# Patient Record
Sex: Male | Born: 1970 | Race: White | Hispanic: No | Marital: Married | Smoking: Never smoker
Health system: Southern US, Community
[De-identification: ages and names within clinical notes are randomized; demographics above are authoritative.]

## PROBLEM LIST (undated history)

## (undated) DIAGNOSIS — N401 Enlarged prostate with lower urinary tract symptoms: Secondary | ICD-10-CM

## (undated) DIAGNOSIS — N3281 Overactive bladder: Secondary | ICD-10-CM

## (undated) DIAGNOSIS — F1020 Alcohol dependence, uncomplicated: Secondary | ICD-10-CM

## (undated) DIAGNOSIS — F32A Depression, unspecified: Secondary | ICD-10-CM

## (undated) DIAGNOSIS — N138 Other obstructive and reflux uropathy: Secondary | ICD-10-CM

## (undated) DIAGNOSIS — G8929 Other chronic pain: Secondary | ICD-10-CM

## (undated) DIAGNOSIS — M6580 Other synovitis and tenosynovitis, unspecified site: Secondary | ICD-10-CM

## (undated) DIAGNOSIS — M659 Synovitis and tenosynovitis, unspecified: Secondary | ICD-10-CM

## (undated) DIAGNOSIS — G4730 Sleep apnea, unspecified: Secondary | ICD-10-CM

## (undated) DIAGNOSIS — E291 Testicular hypofunction: Secondary | ICD-10-CM

## (undated) DIAGNOSIS — F419 Anxiety disorder, unspecified: Secondary | ICD-10-CM

## (undated) DIAGNOSIS — Z87442 Personal history of urinary calculi: Secondary | ICD-10-CM

## (undated) HISTORY — DX: Anxiety disorder, unspecified: F41.9

## (undated) HISTORY — DX: Other synovitis and tenosynovitis, unspecified site: M65.80

## (undated) HISTORY — DX: Testicular hypofunction: E29.1

## (undated) HISTORY — DX: Other obstructive and reflux uropathy: N13.8

## (undated) HISTORY — PX: OTHER SURGICAL HISTORY: SHX169

## (undated) HISTORY — DX: Synovitis and tenosynovitis, unspecified: M65.9

## (undated) HISTORY — DX: Alcohol dependence, uncomplicated: F10.20

## (undated) HISTORY — DX: Other obstructive and reflux uropathy: N40.1

## (undated) HISTORY — DX: Overactive bladder: N32.81

## (undated) HISTORY — DX: Depression, unspecified: F32.A

## (undated) HISTORY — DX: Other chronic pain: G89.29

---

## 2020-12-14 ENCOUNTER — Encounter: Payer: Self-pay | Admitting: Family Medicine

## 2020-12-14 ENCOUNTER — Ambulatory Visit: Payer: 59 | Admitting: Family Medicine

## 2020-12-14 ENCOUNTER — Ambulatory Visit (INDEPENDENT_AMBULATORY_CARE_PROVIDER_SITE_OTHER): Payer: 59

## 2020-12-14 ENCOUNTER — Other Ambulatory Visit: Payer: Self-pay

## 2020-12-14 VITALS — BP 132/72 | HR 75 | Wt 205.0 lb

## 2020-12-14 DIAGNOSIS — M25552 Pain in left hip: Secondary | ICD-10-CM | POA: Diagnosis not present

## 2020-12-14 DIAGNOSIS — M9903 Segmental and somatic dysfunction of lumbar region: Secondary | ICD-10-CM

## 2020-12-14 DIAGNOSIS — M545 Low back pain, unspecified: Secondary | ICD-10-CM

## 2020-12-14 DIAGNOSIS — M9902 Segmental and somatic dysfunction of thoracic region: Secondary | ICD-10-CM | POA: Diagnosis not present

## 2020-12-14 DIAGNOSIS — M25551 Pain in right hip: Secondary | ICD-10-CM | POA: Diagnosis not present

## 2020-12-14 DIAGNOSIS — M533 Sacrococcygeal disorders, not elsewhere classified: Secondary | ICD-10-CM | POA: Diagnosis not present

## 2020-12-14 DIAGNOSIS — M9904 Segmental and somatic dysfunction of sacral region: Secondary | ICD-10-CM | POA: Diagnosis not present

## 2020-12-14 IMAGING — DX DG PELVIS 1-2V
2 series · 2 of 2 positions shown · non-contrast
Comparison: None.

CLINICAL DATA: 49-year-old male with bilateral hip pain.

EXAM:
PELVIS - 1-2 VIEW

[pelvis ap (1 of 2)]
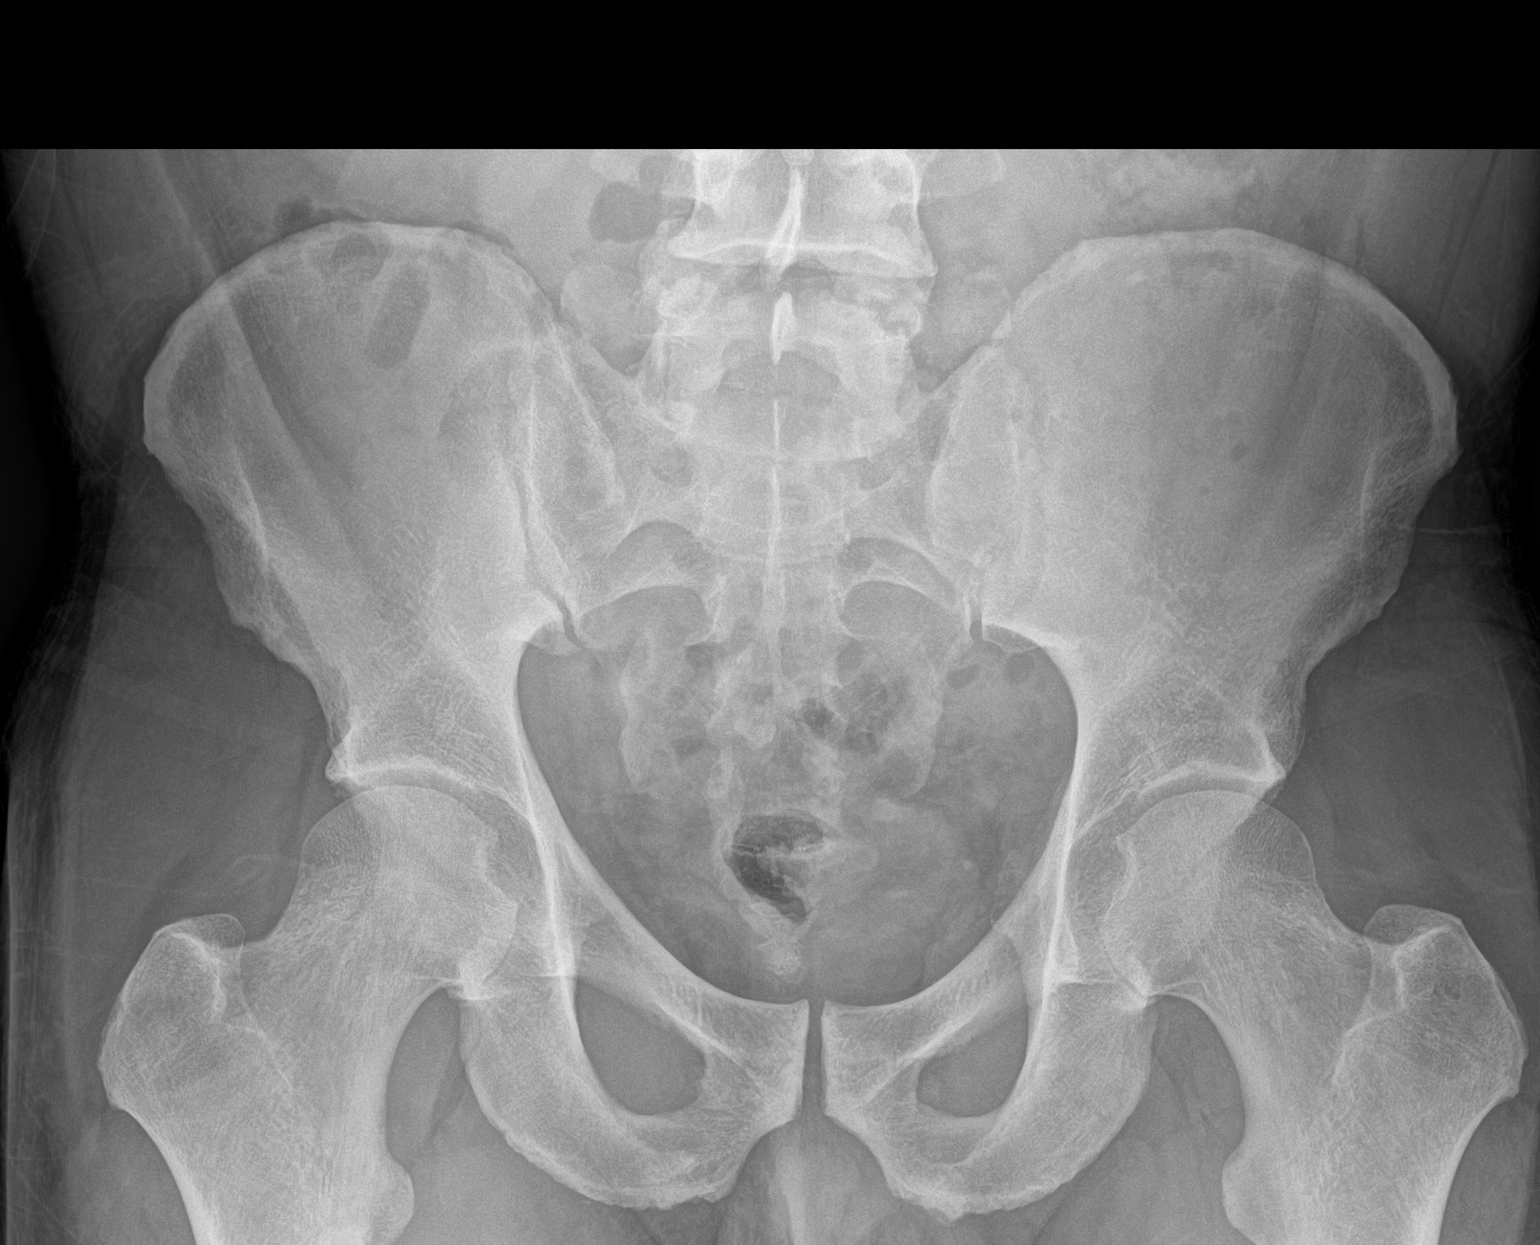

[pelvis ap (2 of 2)]
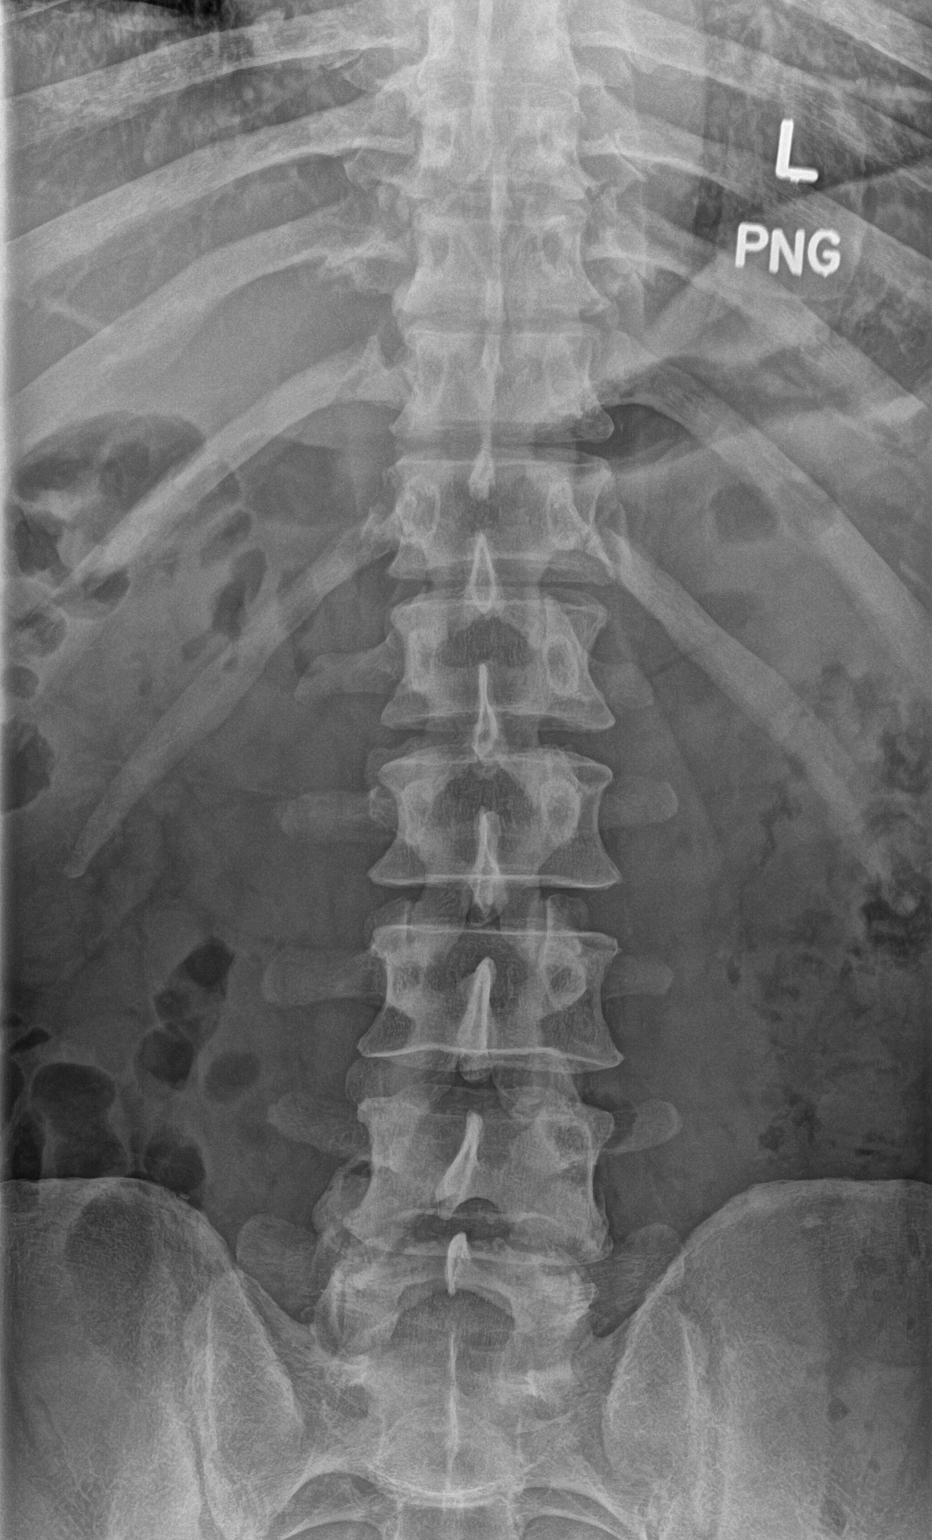

[2 of 2 positions shown; findings below may reference images not displayed]

FINDINGS: There is no acute fracture or dislocation. The bones are well
mineralized. Mild arthritic changes of the hips bilaterally. The
soft tissues are unremarkable.
IMPRESSION: No acute fracture or dislocation.

## 2020-12-14 IMAGING — DX DG LUMBAR SPINE 2-3V
3 series · 3 of 3 positions shown · non-contrast
Comparison: None.

CLINICAL DATA: 49-year-old male with back pain.

EXAM:
LUMBAR SPINE - 2-3 VIEW

[l-spine lateral (1 of 2)]
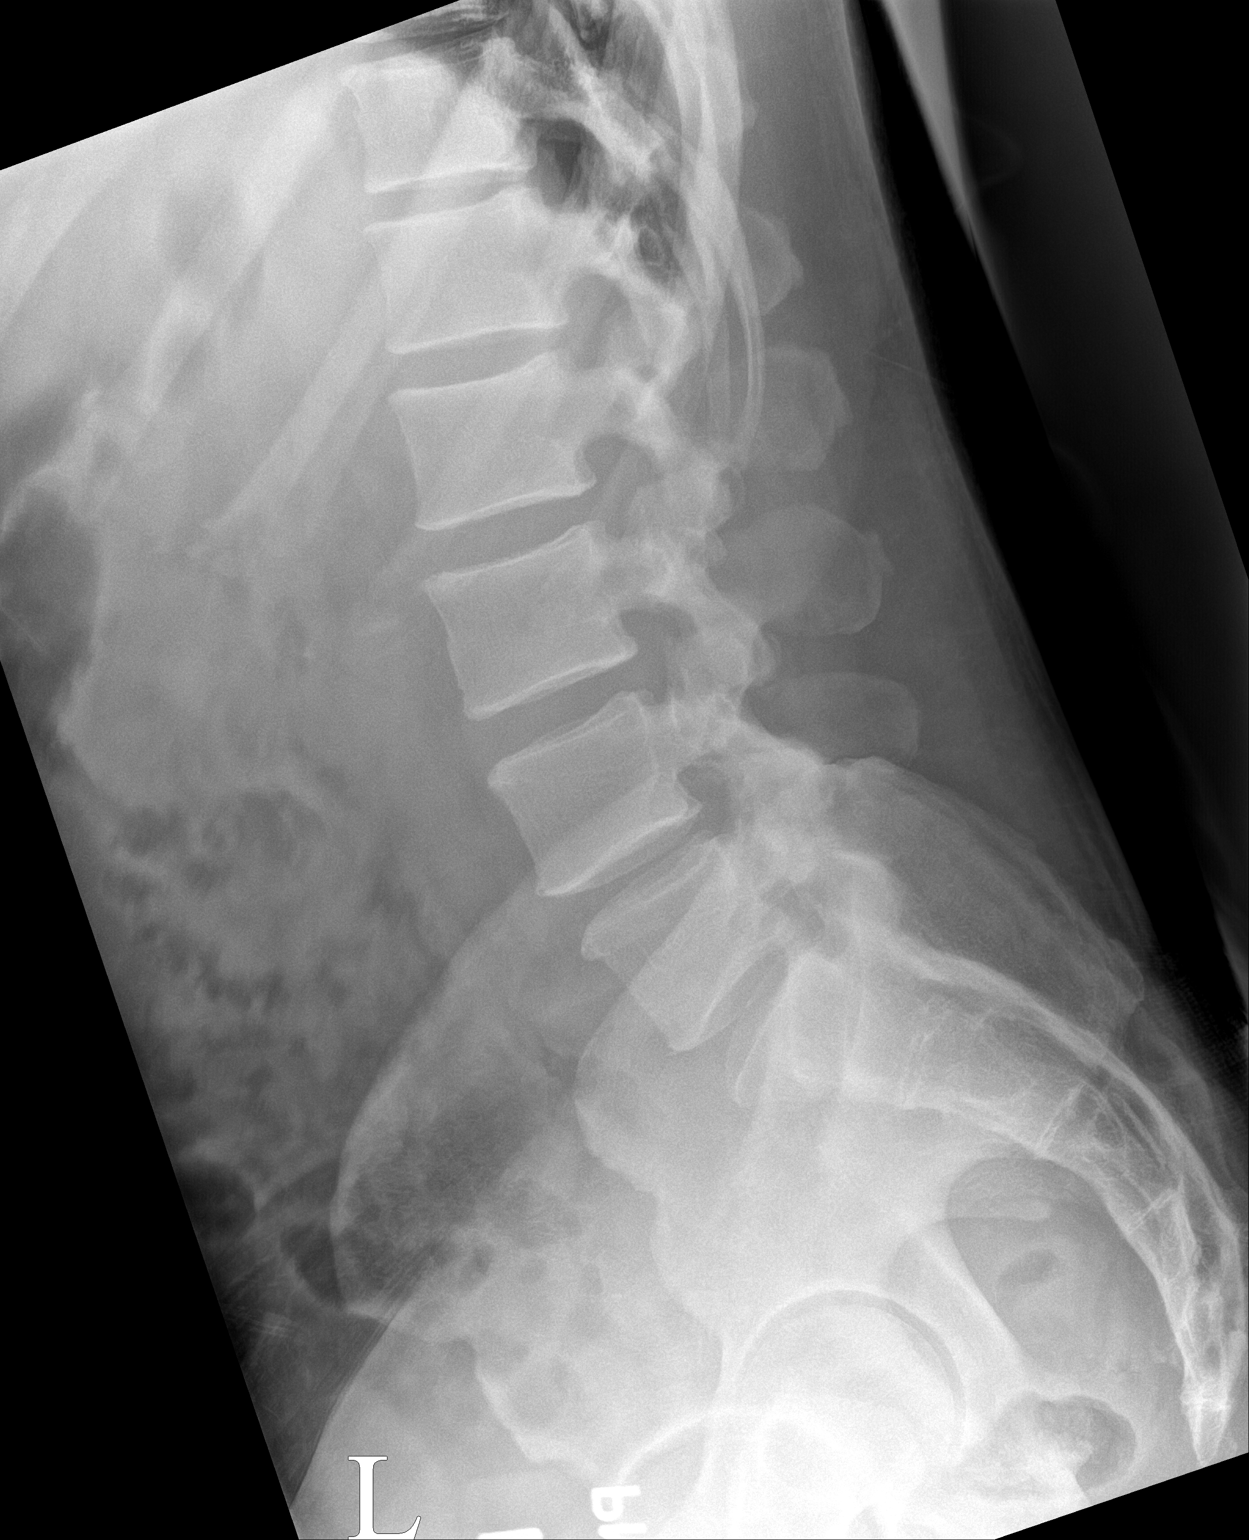

[l-spine lateral (2 of 2)]
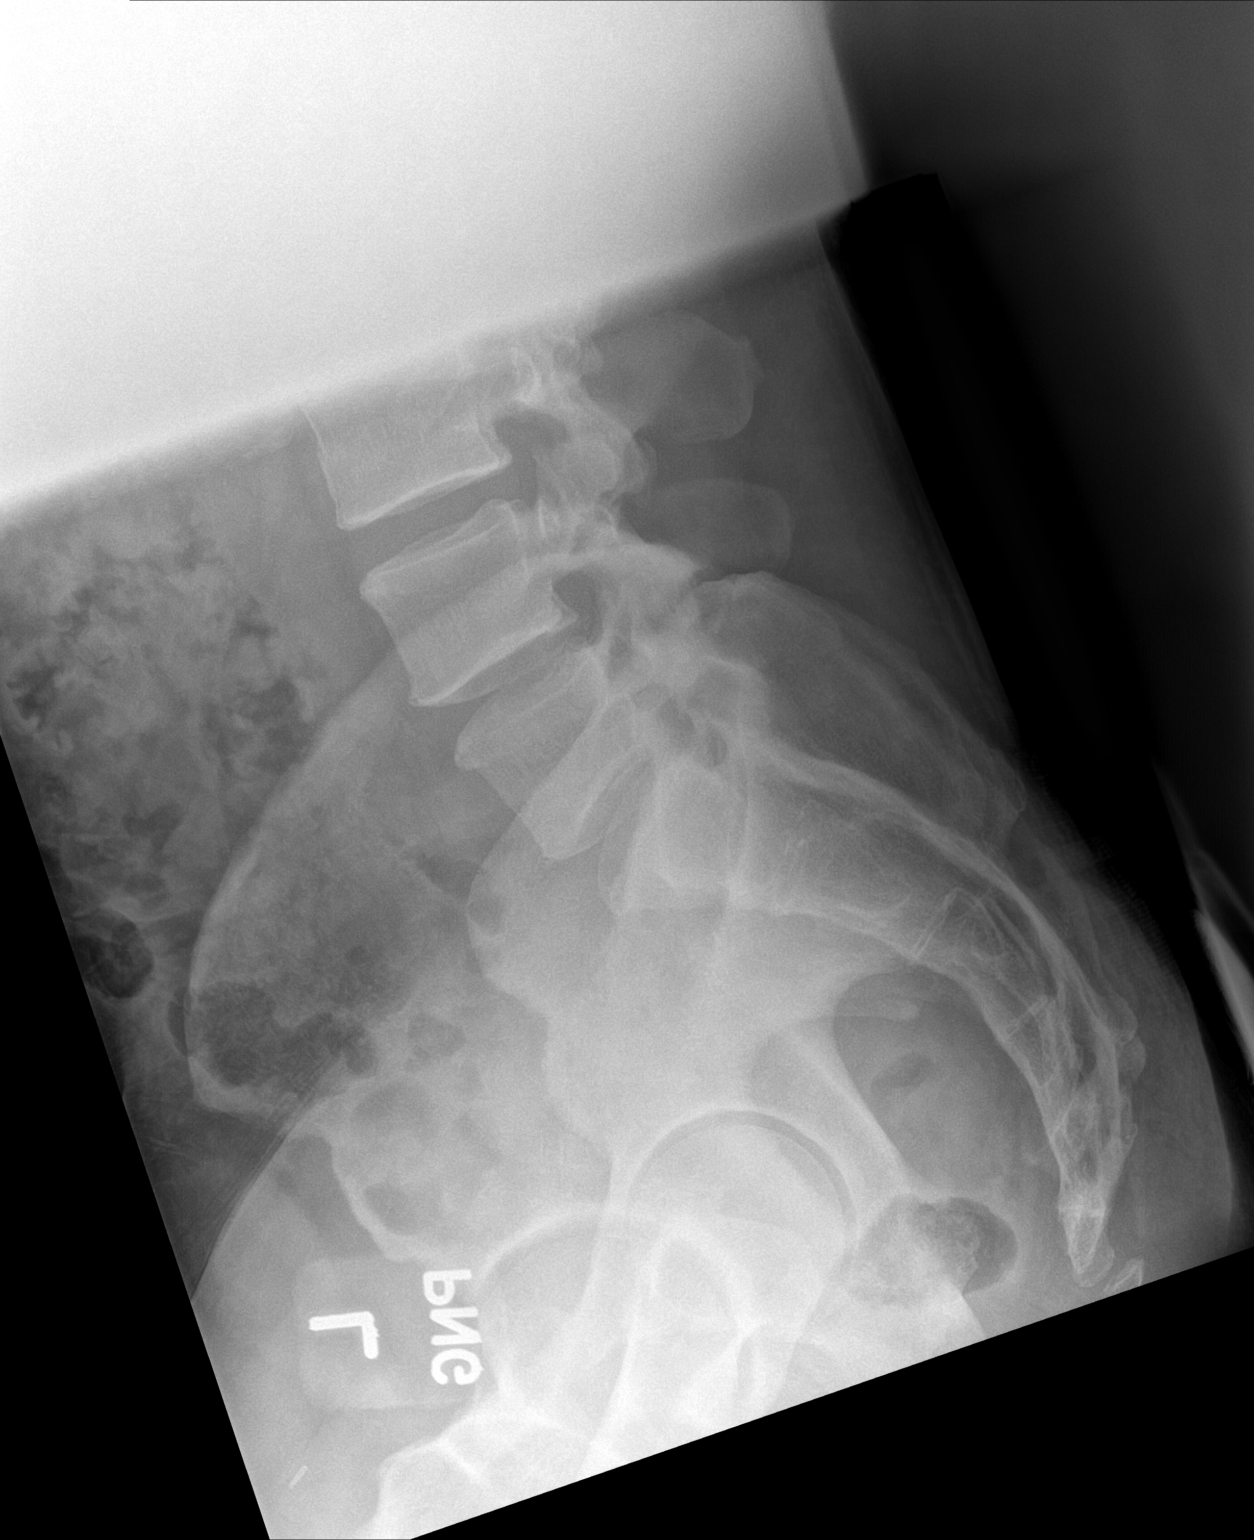

[pelvis ap]
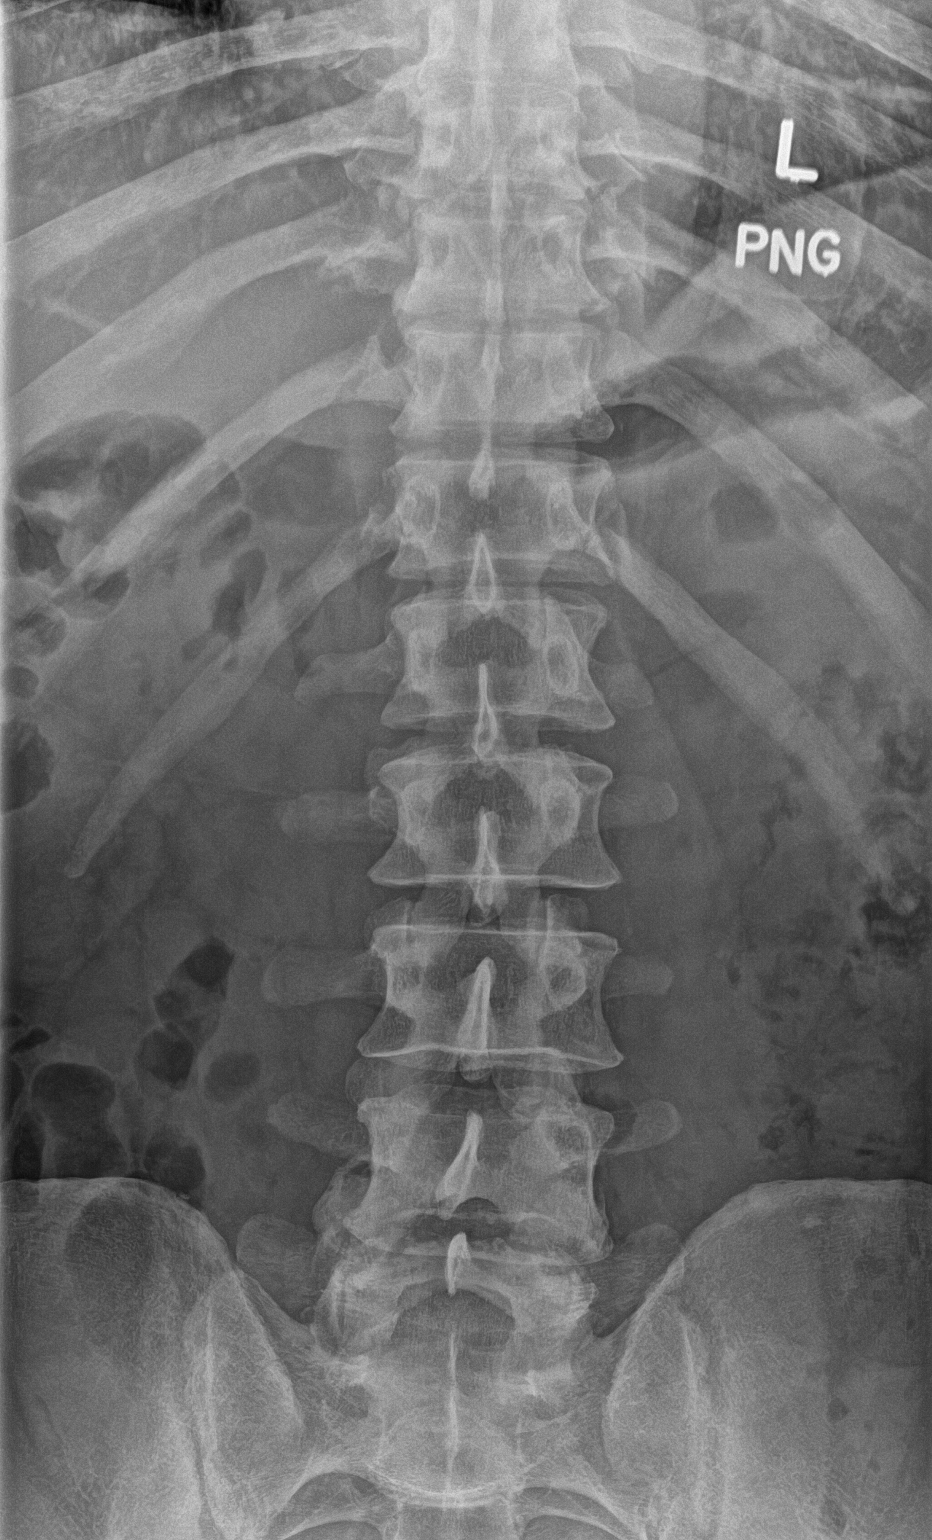

[3 of 3 positions shown; findings below may reference images not displayed]

FINDINGS: Five lumbar type vertebra. There is no acute fracture or subluxation
of the lumbar spine. The vertebral body heights are maintained. Mild
degenerative changes and spurring primarily at L4-L5 and L5-S1. The
visualized posterior elements are intact. The soft tissues are
unremarkable.
IMPRESSION: 1. No acute/traumatic lumbar spine pathology.
2. Mild degenerative changes.

## 2020-12-14 MED ORDER — GABAPENTIN 100 MG PO CAPS: 200.0000 mg | ORAL_CAPSULE | Freq: Every day | ORAL | 0 refills | Status: DC

## 2020-12-14 MED ORDER — MELOXICAM 15 MG PO TABS: 15.0000 mg | ORAL_TABLET | Freq: Every day | ORAL | 0 refills | Status: DC

## 2020-12-14 NOTE — Assessment & Plan Note (Signed)
Patient does have some sacroiliac dysfunction.  Discussed gabapentin and meloxicam.  Discussed posture and ergonomics.  We will get x-rays to further evaluate for any bony abnormality that could be potentially contributing.  Work with Event organiser to learn them in greater response.  Patient responded well to osteopathic manipulation will follow up again in 5 to 6 weeks.

## 2020-12-14 NOTE — Progress Notes (Signed)
[name] [address] [place] [address] Phone: [phone] Subjective:   [name], am serving as a scribe for Dr. Antoine [name].  This visit occurred during the SARS-CoV-2 public health emergency.  Safety protocols were in place, including screening questions prior to the visit, additional usage of staff PPE, and extensive cleaning of exam room while observing appropriate contact time as indicated for disinfecting solutions.   I'm seeing this patient by the request  of:  Pcp, No  CC: Low back pain  [id number]  Joshua [name] is a 50 y.o. male coming in with complaint of back pain. Patient states that he has R glute pain but it can be L glute. Pain is dull with standing. Movement helps with pain. Takes Aleve. Has been stretching.  Reviewing patient's chart he has been in the pelvic floor rehabilitation for 6 weeks.  Social History   Socioeconomic History   Marital status: Married    Spouse name: Not on file   Number of children: Not on file   Years of education: Not on file   Highest education level: Not on file  Occupational History   Not on file  Tobacco Use   Smoking status: Not on file   Smokeless tobacco: Not on file  Substance and Sexual Activity   Alcohol use: Not on file   Drug use: Not on file   Sexual activity: Not on file  Other Topics Concern   Not on file  Social History Narrative   Not on file   Social Determinants of Health   Financial Resource Strain: Not on file  Food Insecurity: Not on file  Transportation Needs: Not on file  Physical Activity: Not on file  Stress: Not on file  Social Connections: Not on file   Not on File No family history on file.     Current Outpatient Medications (Analgesics):    meloxicam (MOBIC) 15 MG tablet, Take 1 tablet (15 mg total) by mouth daily.   Current Outpatient Medications (Other):    gabapentin (NEURONTIN) 100 MG capsule, Take 2 capsules (200 mg total) by  mouth at bedtime.   Reviewed prior external information including notes and imaging from  primary care provider As well as notes that were available from care everywhere and other healthcare systems.  Past medical history, social, surgical and family history all reviewed in electronic medical record.  No pertanent information unless stated regarding to the chief complaint.   Review of Systems:  No headache, visual changes, nausea, vomiting, diarrhea, constipation, dizziness, abdominal pain, skin rash, fevers, chills, night sweats, weight loss, swollen lymph nodes, body aches, joint swelling, chest pain, shortness of breath, mood changes. POSITIVE muscle aches  Objective  Blood pressure 132/72, pulse 75, weight 205 lb (93 kg), SpO2 97 %.   General: No apparent distress alert and oriented x3 mood and affect normal, dressed appropriately.  HEENT: Pupils equal, extraocular movements intact  Respiratory: Patient's speak in full sentences and does not appear short of breath  Cardiovascular: No lower extremity edema, non tender, no erythema  Gait normal with good balance and coordination.  MSK: Low back exam shows the patient does have tenderness to palpation of the paraspinal musculature.  Patient does have tightness of the lumbar spine mostly around the sacroiliac joint. Negative straight leg test.  Patient does have locking of the last 5 degrees of extension of the back.  5/5 strength in lower extremities.  Osteopathic findings T5 extended rotated and  side bent right L1 flexed rotated and side bent right Sacrum right on right  [id number]; 15 additional minutes spent for Therapeutic exercises as stated in above notes.  This included exercises focusing on stretching, strengthening, with significant focus on eccentric aspects.   Long term goals include an improvement in range of motion, strength, endurance as well as avoiding reinjury. Patient's frequency would include in 1-2 times a day, 3-5 times  a week for a duration of 6-12 weeks.  Sacroiliac Joint Mobilization and Rehab 1. Work on pretzel stretching, shoulder back and leg draped in front. 3-5 sets, 30 sec.. 2. hip abductor rotations. standing, hip flexion and rotation outward then inward. 3 sets, 15 reps. when can do comfortably, add ankle weights starting at 2 pounds.  3. cross over stretching - shoulder back to ground, same side leg crossover. 3-5 sets for 30 min..  4. rolling up and back knees to chest and rocking. 5. sacral tilt - 5 sets, hold for 5-10 seconds Proper technique shown and discussed handout in great detail with ATC.  All questions were discussed and answered.     Impression and Recommendations:     The above documentation has been reviewed and is accurate and complete Joshua Saa, DO

## 2020-12-14 NOTE — Patient Instructions (Addendum)
SI joint Meloxicam 15mg  for 3 days  Gabapentin 200mg  at night See me in 6-8 weeks

## 2020-12-14 NOTE — Assessment & Plan Note (Signed)

## 2021-01-25 ENCOUNTER — Ambulatory Visit: Payer: 59 | Admitting: Family Medicine

## 2021-01-25 ENCOUNTER — Encounter: Payer: Self-pay | Admitting: Family Medicine

## 2021-01-25 ENCOUNTER — Other Ambulatory Visit: Payer: Self-pay

## 2021-01-25 VITALS — BP 128/84 | HR 67 | Ht 70.0 in | Wt 206.0 lb

## 2021-01-25 DIAGNOSIS — M9904 Segmental and somatic dysfunction of sacral region: Secondary | ICD-10-CM

## 2021-01-25 DIAGNOSIS — M9903 Segmental and somatic dysfunction of lumbar region: Secondary | ICD-10-CM

## 2021-01-25 DIAGNOSIS — M533 Sacrococcygeal disorders, not elsewhere classified: Secondary | ICD-10-CM

## 2021-01-25 DIAGNOSIS — M9902 Segmental and somatic dysfunction of thoracic region: Secondary | ICD-10-CM

## 2021-01-25 NOTE — Progress Notes (Signed)
[name] [address] [place] [address] Phone: [phone] Subjective:   [name], am serving as a scribe for Dr. Antoine Primas.  This visit occurred during the SARS-CoV-2 public health emergency.  Safety protocols were in place, including screening questions prior to the visit, additional usage of staff PPE, and extensive cleaning of exam room while observing appropriate contact time as indicated for disinfecting solutions.   I'm seeing this patient by the request  of:  Pcp, No  CC: Low back pain  [id number]  Joshua [name] is a 50 y.o. male coming in with complaint of back and neck pain. OMT 12/14/2020. Patient states that he still has pain intermittently in lumbar spine.  Patient states that the medication has helped him significantly with some of his back as well as even with his urinary incontinence.  Sleeping well comfortably as well.  Regular basis.  Medications patient has been prescribed: Gabapentin, Meloxicam  Taking: Yes         Reviewed prior external information including notes and imaging from previsou exam, outside providers and external EMR if available.   As well as notes that were available from care everywhere and other healthcare systems.  Past medical history, social, surgical and family history all reviewed in electronic medical record.  No pertanent information unless stated regarding to the chief complaint.   No past medical history on file.  Not on File   Review of Systems:  No headache, visual changes, nausea, vomiting, diarrhea, constipation, dizziness, abdominal pain, skin rash, fevers, chills, night sweats, weight loss, swollen lymph nodes, body aches, joint swelling, chest pain, shortness of breath, mood changes. POSITIVE muscle aches  Objective  Blood pressure 128/84, pulse 67, height 5\' 10"  (1.778 m), weight 206 lb (93.4 kg), SpO2 99 %.   General: No apparent distress alert and oriented x3 mood  and affect normal, dressed appropriately.  HEENT: Pupils equal, extraocular movements intact  Respiratory: Patient's speak in full sentences and does not appear short of breath  Cardiovascular: No lower extremity edema, non tender, no erythema  Low back exam still has some tightness of the right sacroiliac joint.  He does have some limitation in extension of the last 5 degrees.  Mild tightness with FABER test on the right compared to left.  Osteopathic findings   T5 extended rotated and side bent left L2 flexed rotated and side bent right Sacrum right on right       Assessment and Plan:  SI (sacroiliac) joint dysfunction Patient overall seems to be doing relatively well.  Continue to have some discomfort encourage patient to try the gabapentin up to 300 mg and see how he does and we can refill at a higher dose if necessary.  Patient states that it is helpful with some of his urinary incontinence as well.  Discussed with patient to continue to be active otherwise.  Follow-up with me again 6 to 8 weeks   Nonallopathic problems  Decision today to treat with OMT was based on Physical Exam  After verbal consent patient was treated with HVLA, ME, FPR techniques in  thoracic, lumbar, and sacral  areas  Patient tolerated the procedure well with improvement in symptoms  Patient given exercises, stretches and lifestyle modifications  See medications in patient instructions if given  Patient will follow up in 4-8 weeks      The above documentation has been reviewed and is accurate and complete , DO  Note: This dictation was prepared with Dragon dictation along with smaller phrase technology. Any transcriptional errors that result from this process are unintentional.

## 2021-01-25 NOTE — Patient Instructions (Signed)
Overall good Continue same stretches Avoid repetitive extension of back See me in 7-8 weeks

## 2021-01-25 NOTE — Assessment & Plan Note (Signed)
Patient overall seems to be doing relatively well.  Continue to have some discomfort encourage patient to try the gabapentin up to 300 mg and see how he does and we can refill at a higher dose if necessary.  Patient states that it is helpful with some of his urinary incontinence as well.  Discussed with patient to continue to be active otherwise.  Follow-up with me again 6 to 8 weeks

## 2021-03-14 NOTE — Progress Notes (Signed)
[name] [address] [place] [address] Phone: [phone] Subjective:   Joshua [name], am serving as a scribe for Dr. Antoine Primas. This visit occurred during the SARS-CoV-2 public health emergency.  Safety protocols were in place, including screening questions prior to the visit, additional usage of staff PPE, and extensive cleaning of exam room while observing appropriate contact time as indicated for disinfecting solutions.   I'm seeing this patient by the request  of:  Pcp, No  CC: Back pain follow-up and knee pain  [id number]  Joshua [name] is a 50 y.o. male coming in with complaint of back and neck pain. OMT 01/25/2021. Patient states back and neck pain remain the same. Does want to talk about his right knee pain. Was told he needed surgery, but wants a second opinion.  Patient has had 5 surgeries on this knee previously.  States that he continues to have decreased range of motion of the knee.  Wants to know if there is anything else that can be done.  Medications patient has been prescribed: Gabapentin, Meloxicam  Taking:         Reviewed prior external information including notes and imaging from previsou exam, outside providers and external EMR if available.   As well as notes that were available from care everywhere and other healthcare systems.  Past medical history, social, surgical and family history all reviewed in electronic medical record.  No pertanent information unless stated regarding to the chief complaint.   No past medical history on file.  Not on File   Review of Systems:  No headache, visual changes, nausea, vomiting, diarrhea, constipation, dizziness, abdominal pain, skin rash, fevers, chills, night sweats, weight loss, swollen lymph nodes, body aches, joint swelling, chest pain, shortness of breath, mood changes. POSITIVE muscle aches  Objective  Blood pressure 140/68, pulse 75, height 5\' 10"  (1.778 m),  weight 208 lb (94.3 kg), SpO2 98 %.   General: No apparent distress alert and oriented x3 mood and affect normal, dressed appropriately.  HEENT: Pupils equal, extraocular movements intact  Respiratory: Patient's speak in full sentences and does not appear short of breath  Cardiovascular: No lower extremity edema, non tender, no erythema  Right knee exam shows the patient does lack the last 10 degrees of flexion.  Fullness noted in the popliteal area.  Patient does have tenderness over the medial joint line.  Full extension noted.  Good muscle strength surrounding the knee Low back continues to have tightness of the sacroiliac joint right greater than left.  Patient does have a positive FABER test.  Negative straight leg test noted.  Osteopathic findings   T9 extended rotated and side bent left L2 flexed rotated and side bent right Sacrum right on right   Limited muscular skeletal ultrasound was performed and interpreted by , M  Limited ultrasound of patient's right knee shows that patient does have a moderate to severe narrowing of the medial joint space and postsurgical changes of the meniscus noted.  Patient also has a large Baker's cyst noted.  Mild to moderate narrowing of the patellofemoral joint with small effusion.  Procedure: Real-time Ultrasound Guided Injection of right knee Device: GE Logiq Q7 Ultrasound guided injection is preferred based studies that show increased duration, increased effect, greater accuracy, decreased procedural pain, increased response rate, and decreased cost with ultrasound guided versus blind injection.  Verbal informed consent obtained.  Time-out conducted.  Noted no overlying erythema, induration, or other signs  of local infection.  Skin prepped in a sterile fashion.  Local anesthesia: Topical Ethyl chloride.  With sterile technique and under real time ultrasound guidance: With a 22-gauge 2 inch needle patient was injected with 4 cc of  0.5% Marcaine and had difficulty with aspiration and injected patient again and then had aspiration of 12 cc of straw-colored fluid and then injected 1 cc of Kenalog 40 mg/dL. This was from a posterior approach.   Pain improved with improvement of range of motion suggesting accurate placement of the medication.  Advised to call if fevers/chills, erythema, induration, drainage, or persistent bleeding.  Impression: Technically successful ultrasound guided injection.    Assessment and Plan:  Degenerative arthritis of right knee Patient has had 5 previous surgeries on this knee.  Patient has had removal of the medial meniscus and does have fairly advanced osteoarthritic changes for his age.  Baker's cyst noted as well and attempted aspiration today with some improvement.  Discussed with patient about icing regimen and home exercises.  We will get approval for possible viscosupplementation.  Follow-up again in 6 to 8 weeks Did need to attempt aspiration to time so did give patient doxycycline in case there is any redness noted patient to start the antibiotics.  SI (sacroiliac) joint dysfunction Chronic problem with exacerbation.  Discussed icing regimen and home exercises, discussed which activities to do which wants to avoid.  Follow-up again in 6 to 8 weeks   Nonallopathic problems  Decision today to treat with OMT was based on Physical Exam  After verbal consent patient was treated with HVLA, ME, FPR techniques in  thoracic, lumbar, and sacral  areas  Patient tolerated the procedure well with improvement in symptoms  Patient given exercises, stretches and lifestyle modifications  See medications in patient instructions if given  Patient will follow up in 4-8 weeks      The above documentation has been reviewed and is accurate and complete Joshua Saa, DO        Note: This dictation was prepared with Dragon dictation along with smaller phrase technology. Any transcriptional  errors that result from this process are unintentional.

## 2021-03-15 ENCOUNTER — Ambulatory Visit (INDEPENDENT_AMBULATORY_CARE_PROVIDER_SITE_OTHER): Payer: 59

## 2021-03-15 ENCOUNTER — Encounter: Payer: Self-pay | Admitting: Family Medicine

## 2021-03-15 ENCOUNTER — Ambulatory Visit: Payer: 59 | Admitting: Family Medicine

## 2021-03-15 ENCOUNTER — Ambulatory Visit: Payer: Self-pay

## 2021-03-15 ENCOUNTER — Other Ambulatory Visit: Payer: Self-pay

## 2021-03-15 VITALS — BP 140/68 | HR 75 | Ht 70.0 in | Wt 208.0 lb

## 2021-03-15 DIAGNOSIS — M533 Sacrococcygeal disorders, not elsewhere classified: Secondary | ICD-10-CM | POA: Diagnosis not present

## 2021-03-15 DIAGNOSIS — M9904 Segmental and somatic dysfunction of sacral region: Secondary | ICD-10-CM | POA: Diagnosis not present

## 2021-03-15 DIAGNOSIS — M25561 Pain in right knee: Secondary | ICD-10-CM | POA: Diagnosis not present

## 2021-03-15 DIAGNOSIS — M9902 Segmental and somatic dysfunction of thoracic region: Secondary | ICD-10-CM

## 2021-03-15 DIAGNOSIS — M9903 Segmental and somatic dysfunction of lumbar region: Secondary | ICD-10-CM | POA: Diagnosis not present

## 2021-03-15 DIAGNOSIS — M1711 Unilateral primary osteoarthritis, right knee: Secondary | ICD-10-CM

## 2021-03-15 DIAGNOSIS — M170 Bilateral primary osteoarthritis of knee: Secondary | ICD-10-CM | POA: Insufficient documentation

## 2021-03-15 IMAGING — DX DG KNEE 3 VIEWS*R*
3 series · 3 of 3 positions shown · non-contrast
Comparison: None.

CLINICAL DATA: Right-sided knee pain

EXAM:
RIGHT KNEE - 3 VIEW

[knee ap]
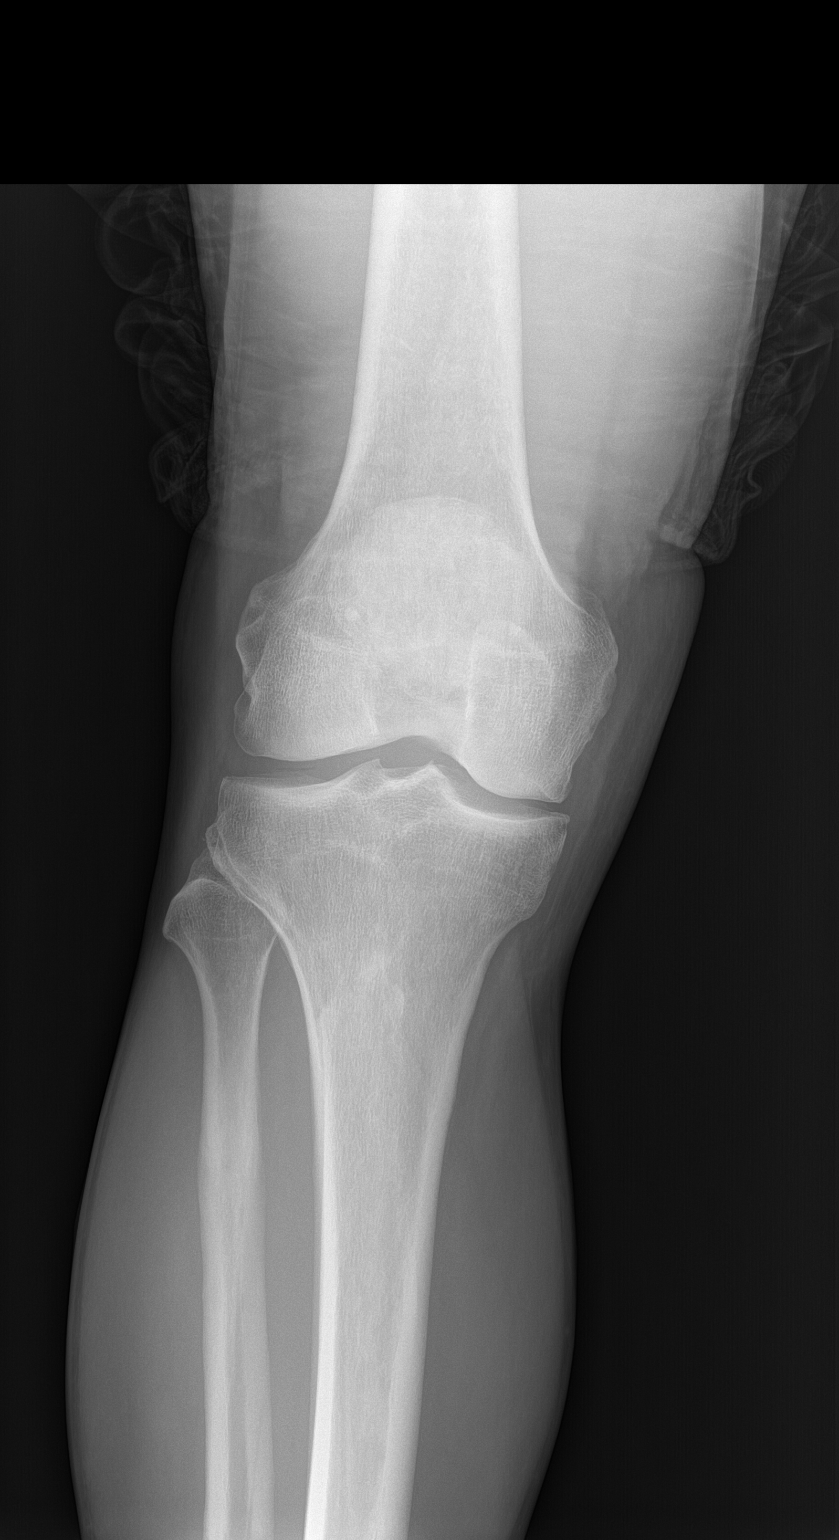

[knee lat]
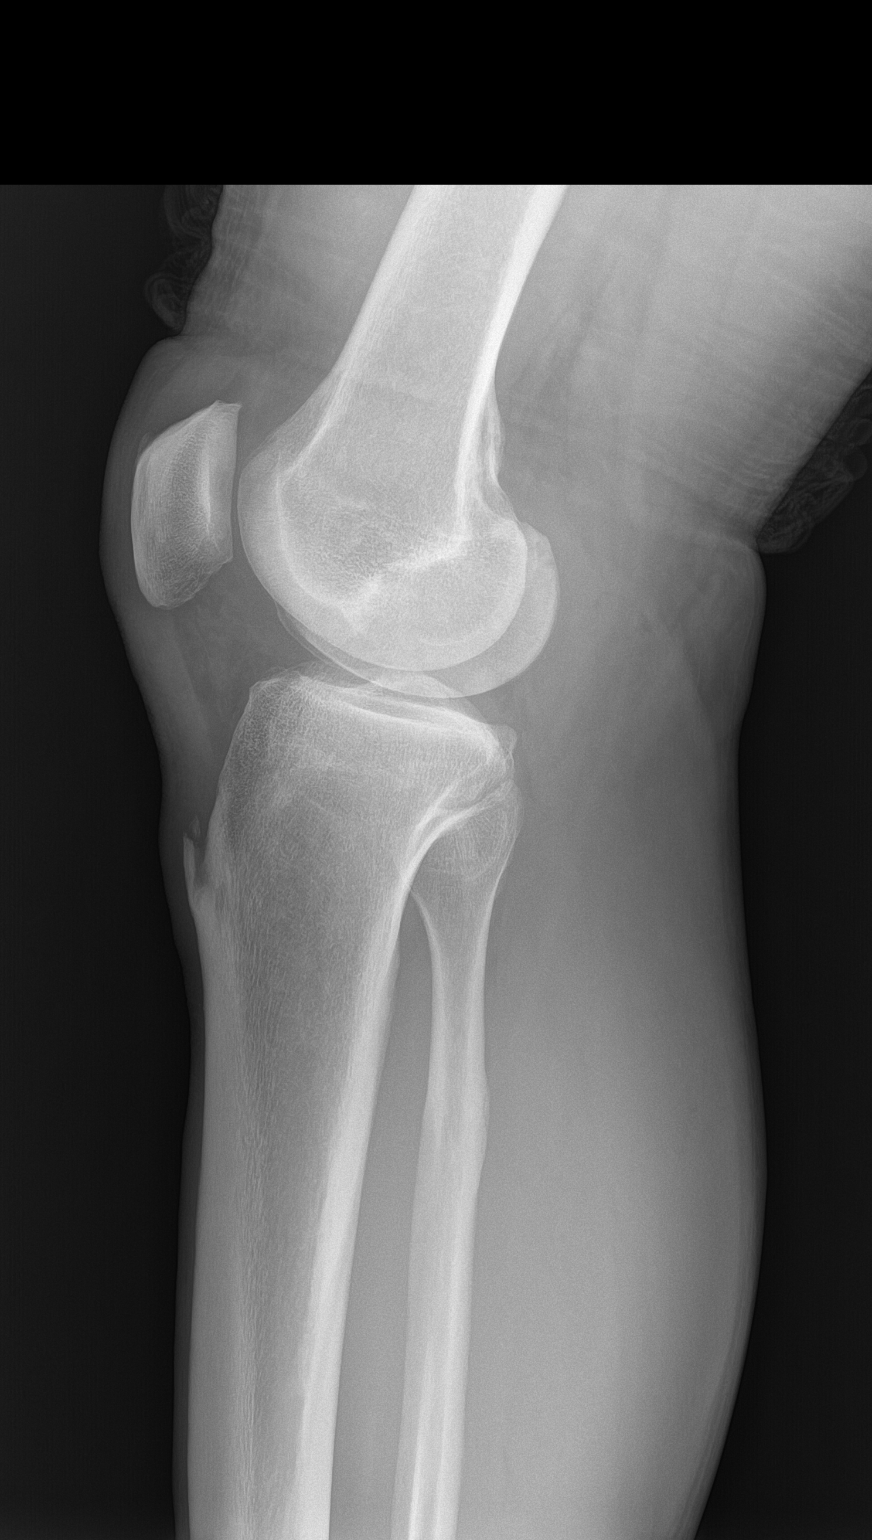

[patella]
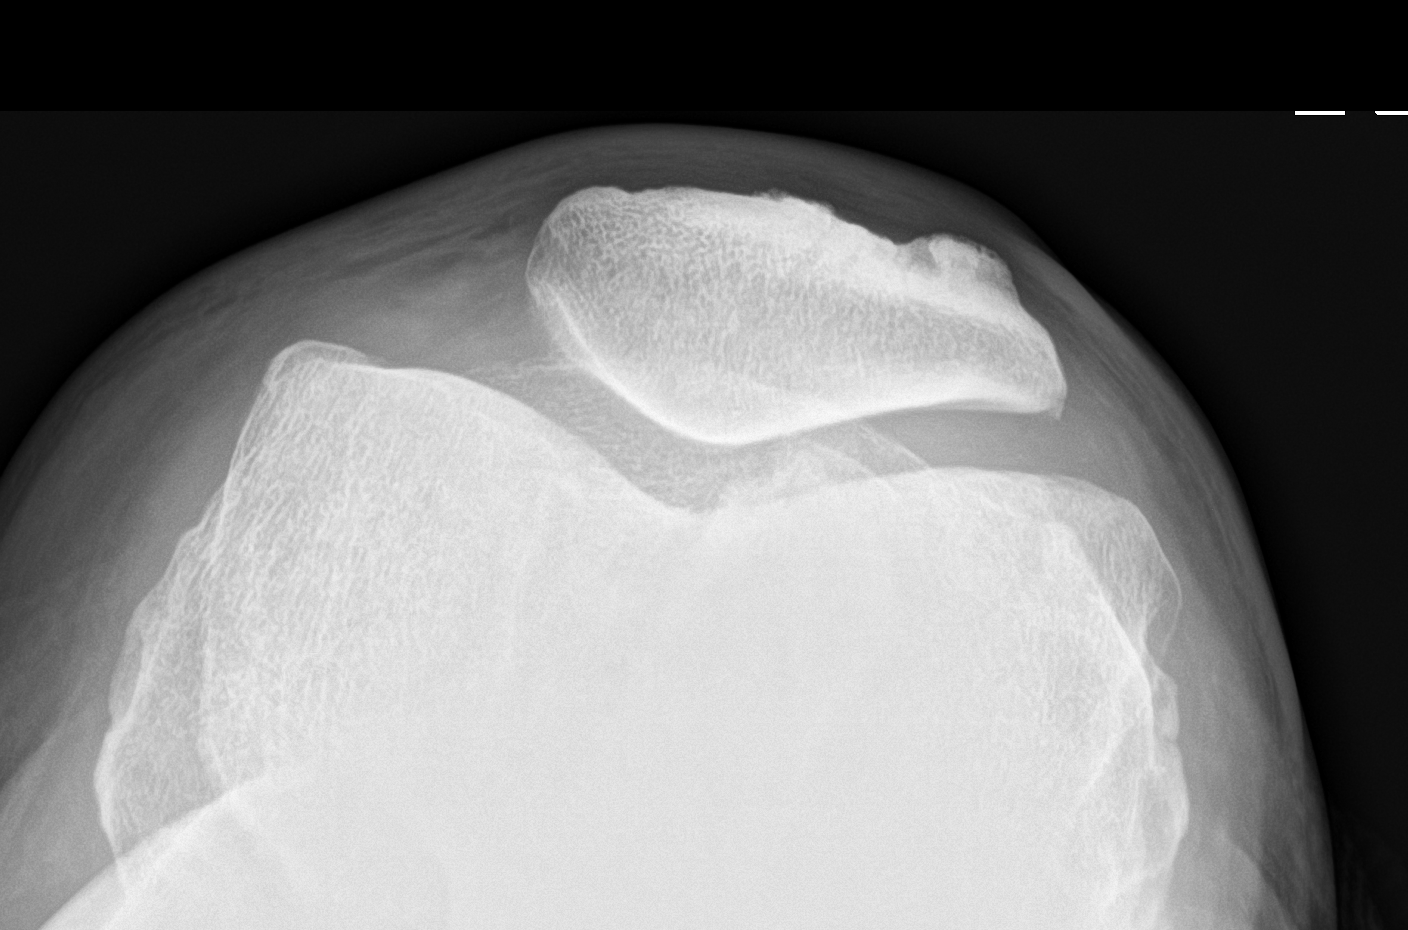

[3 of 3 positions shown; findings below may reference images not displayed]

FINDINGS: No fracture or malalignment. Mild medial degenerative change. No
sizable knee effusion
IMPRESSION: Mild degenerative change.  No acute osseous abnormality

## 2021-03-15 MED ORDER — DOXYCYCLINE HYCLATE 100 MG PO TABS: 100.0000 mg | ORAL_TABLET | Freq: Two times a day (BID) | ORAL | 0 refills | Status: DC

## 2021-03-15 MED ORDER — GABAPENTIN 100 MG PO CAPS: 200.0000 mg | ORAL_CAPSULE | Freq: Every day | ORAL | 0 refills | Status: AC

## 2021-03-15 NOTE — Assessment & Plan Note (Signed)
Chronic problem with exacerbation.  Discussed icing regimen and home exercises, discussed which activities to do which wants to avoid.  Follow-up again in 6 to 8 weeks

## 2021-03-15 NOTE — Patient Instructions (Signed)
Xray today Antibiotic just in case See you again in 4-6 weeks

## 2021-03-15 NOTE — Assessment & Plan Note (Addendum)
Patient has had 5 previous surgeries on this knee.  Patient has had removal of the medial meniscus and does have fairly advanced osteoarthritic changes for his age.  Baker's cyst noted as well and attempted aspiration today with some improvement.  Discussed with patient about icing regimen and home exercises.  We will get approval for possible viscosupplementation.  Follow-up again in 6 to 8 weeks Did need to attempt aspiration to time so did give patient doxycycline in case there is any redness noted patient to start the antibiotics.

## 2021-04-11 NOTE — Progress Notes (Deleted)
[name] [address] [place] [address] Phone: [phone] Subjective:    I'm seeing this patient by the request  of:  Pcp, No  CC:   [id number]  03/15/2021 Patient has had 5 previous surgeries on this knee.  Patient has had removal of the medial meniscus and does have fairly advanced osteoarthritic changes for his age.  Baker's cyst noted as well and attempted aspiration today with some improvement.  Discussed with patient about icing regimen and home exercises.  We will get approval for possible viscosupplementation.  Follow-up again in 6 to 8 weeks Did need to attempt aspiration to time so did give patient doxycycline in case there is any redness noted patient to start the antibiotics.  Update 04/12/2021 Joshua [name] is a 50 y.o. male coming in with complaint of R knee pain.        No past medical history on file. No past surgical history on file. Social History   Socioeconomic History   Marital status: Married    Spouse name: Not on file   Number of children: Not on file   Years of education: Not on file   Highest education level: Not on file  Occupational History   Not on file  Tobacco Use   Smoking status: Not on file   Smokeless tobacco: Not on file  Substance and Sexual Activity   Alcohol use: Not on file   Drug use: Not on file   Sexual activity: Not on file  Other Topics Concern   Not on file  Social History Narrative   Not on file   Social Determinants of Health   Financial Resource Strain: Not on file  Food Insecurity: Not on file  Transportation Needs: Not on file  Physical Activity: Not on file  Stress: Not on file  Social Connections: Not on file   Not on File No family history on file.     Current Outpatient Medications (Analgesics):    meloxicam (MOBIC) 15 MG tablet, Take 1 tablet (15 mg total) by mouth daily.   Current Outpatient Medications (Other):    doxycycline (VIBRA-TABS) 100 MG  tablet, Take 1 tablet (100 mg total) by mouth 2 (two) times daily.   gabapentin (NEURONTIN) 100 MG capsule, Take 2 capsules (200 mg total) by mouth at bedtime.   Reviewed prior external information including notes and imaging from  primary care provider As well as notes that were available from care everywhere and other healthcare systems.  Past medical history, social, surgical and family history all reviewed in electronic medical record.  No pertanent information unless stated regarding to the chief complaint.   Review of Systems:  No headache, visual changes, nausea, vomiting, diarrhea, constipation, dizziness, abdominal pain, skin rash, fevers, chills, night sweats, weight loss, swollen lymph nodes, body aches, joint swelling, chest pain, shortness of breath, mood changes. POSITIVE muscle aches  Objective  There were no vitals taken for this visit.   General: No apparent distress alert and oriented x3 mood and affect normal, dressed appropriately.  HEENT: Pupils equal, extraocular movements intact  Respiratory: Patient's speak in full sentences and does not appear short of breath  Cardiovascular: No lower extremity edema, non tender, no erythema  Gait normal with good balance and coordination.  MSK:  Non tender with full range of motion and good stability and symmetric strength and tone of shoulders, elbows, wrist, hip, knee and ankles bilaterally.     Impression and Recommendations:  The above documentation has been reviewed and is accurate and complete [name]

## 2021-04-12 ENCOUNTER — Ambulatory Visit: Payer: 59 | Admitting: Family Medicine

## 2021-04-24 ENCOUNTER — Other Ambulatory Visit: Payer: Self-pay

## 2021-04-24 ENCOUNTER — Ambulatory Visit: Payer: 59 | Admitting: Sports Medicine

## 2021-04-24 VITALS — HR 77 | Ht 70.0 in | Wt 207.0 lb

## 2021-04-24 DIAGNOSIS — M25561 Pain in right knee: Secondary | ICD-10-CM | POA: Diagnosis not present

## 2021-04-24 DIAGNOSIS — M1711 Unilateral primary osteoarthritis, right knee: Secondary | ICD-10-CM

## 2021-04-24 DIAGNOSIS — M9903 Segmental and somatic dysfunction of lumbar region: Secondary | ICD-10-CM

## 2021-04-24 DIAGNOSIS — G8929 Other chronic pain: Secondary | ICD-10-CM

## 2021-04-24 DIAGNOSIS — Z79899 Other long term (current) drug therapy: Secondary | ICD-10-CM

## 2021-04-24 DIAGNOSIS — M9902 Segmental and somatic dysfunction of thoracic region: Secondary | ICD-10-CM | POA: Diagnosis not present

## 2021-04-24 DIAGNOSIS — S3981XA Other specified injuries of abdomen, initial encounter: Secondary | ICD-10-CM

## 2021-04-24 DIAGNOSIS — M9905 Segmental and somatic dysfunction of pelvic region: Secondary | ICD-10-CM

## 2021-04-24 MED ORDER — MELOXICAM 15 MG PO TABS: 15.0000 mg | ORAL_TABLET | Freq: Every day | ORAL | 0 refills | Status: DC

## 2021-04-24 NOTE — Progress Notes (Signed)
Joshua [name] [facility] [address] [place] [address] Phone: [phone]   Assessment and Plan:     1. Primary osteoarthritis of right knee 2. Chronic pain of right knee -Chronic with exacerbation, subsequent visit - History of 5+ procedures to right knee including meniscectomy with chronic knee pain - Patient elected to start with CSI to right knee.  Tolerated well per note below - If patient does not receive significant improvement with injection, we can consider HA injection at follow-up visit which patient has already been approved for  3. Somatic dysfunction of thoracic region 4. Somatic dysfunction of lumbar region 5. Somatic dysfunction of pelvic region -Chronic with exacerbation, subsequent visit - Multiple musculoskeletal complaints that are generally improved with OMT.  Patient elected for repeat OMT.  Tolerated well per note below.  Decision today to treat with OMT was based on Physical Exam   After verbal consent patient was treated with HVLA (high velocity low amplitude), ME (muscle energy), FPR (flex positional release), ST (soft tissue), PC/PD (Pelvic Compression/ Pelvic Decompression) techniques in thoracic, lumbar, and pelvic areas. Patient tolerated the procedure well with improvement in symptoms.  Patient educated on potential side effects of soreness and recommended to rest, hydrate, and use Tylenol as needed for pain control.   6. Sports hernia, initial encounter -Acute, initial visit - Consistent with athletic pubalgia of right side based on HPI, physical exam - Activity modification to decrease physical activities that activate adductor's and rectus abdominis - HEP provided  7. Encounter for long-term current use of medication -Discussed multiple medications with patient - Discussed that patient should establish care with a PCP for ongoing monitoring of chronic medications and conditions.  Referral sent so patient can  establish care with PCP - Patient takes daily SSRI.  He says that he has 30 days worth of medication remaining.  I informed him that I would do a one-time temporary refill of this medication if needed if he cannot establish care within 30 days.  Otherwise he should get refills from PCP - Patient requested refill of naltrexone which is helped him quit drinking in the past.  I informed him that this requires ongoing monitoring and should not be performed by this office.  He can establish care to discuss treatment with PCP - Patient continues to take daily NSAIDs including Aleve every day and intermittent use of meloxicam.  I discussed with patient that his daily use of NSAIDs and doubling up of NSAIDs are putting him at risk for kidney disease, stomach ulcers, heart attack.  Recommend discontinuation of daily NSAID use and may use meloxicam as needed, recommending <1-2 times per week.    Decision today to treat with OMT was based on Physical Exam   After verbal consent patient was treated with HVLA (high velocity low amplitude), ME (muscle energy), FPR (flex positional release), ST (soft tissue), PC/PD (Pelvic Compression/ Pelvic Decompression) techniques in cervical, rib, thoracic, lumbar, and pelvic areas. Patient tolerated the procedure well with improvement in symptoms.  Patient educated on potential side effects of soreness and recommended to rest, hydrate, and use Tylenol as needed for pain control.   Pertinent previous records reviewed include previous office note.  Last knee x-ray.   Follow Up: 4 weeks for repeat OMT.  Discuss effectiveness of CSI to right knee and whether patient wants HA injection.  Confirmed that patient establish care with primary care   Subjective:   I, Joshua [name], am serving as a  scribe for Dr. Richardean Sale.  Chief Complaint: Back and neck pain   HPI:   04/24/21 Patient is a 50 year old male presenting with neck and back pain. Patient last saw Dr. Katrinka Blazing on  03/15/21 and had OMT. Today patient right knee pain is still apparent. May want steroid injection. Talk about prescriptions.  Relevant Historical Information: Frequent alcohol use, multiple surgeries to right knee including medial meniscectomy  Additional pertinent review of systems negative.  Current Outpatient Medications  Medication Sig Dispense Refill   doxycycline (VIBRA-TABS) 100 MG tablet Take 1 tablet (100 mg total) by mouth 2 (two) times daily. 14 tablet 0   gabapentin (NEURONTIN) 100 MG capsule Take 2 capsules (200 mg total) by mouth at bedtime. 180 capsule 0   meloxicam (MOBIC) 15 MG tablet Take 1 tablet (15 mg total) by mouth daily. 30 tablet 0   No current facility-administered medications for this visit.      Objective:     Vitals:   04/24/21 1014  Pulse: 77  SpO2: 95%  Weight: 207 lb (93.9 kg)  Height: 5\' 10"  (1.778 m)      Body mass index is 29.7 kg/m.    Physical Exam:     General: Well-appearing, cooperative, sitting comfortably in no acute distress.   OMT Physical Exam:  ASIS Compression Test: Positive Right Thoracic: TTP paraspinal, T6-10 RRSL Lumbar: TTP paraspinal, L1-3 RRSL Pelvis: Right anterior innominate  General:  awake, alert oriented, no acute distress nontoxic Skin: no suspicious lesions or rashes Neuro:sensation intact, no deficits, strength 5/5 with no deficits, no atrophy, normal muscle tone Psych: No signs of anxiety, depression or other mood disorder  Right knee: No swelling No deformity Neg fluid wave, joint milking ROM Flex 110, Ext 0 NTTP over the quad tendon, medial fem condyle, lat fem condyle, patella, plica, patella tendon, tibial tuberostiy, fibular head, posterior fossa, pes anserine bursa, gerdy's tubercle, medial jt line, lateral jt line Neg anterior and posterior drawer Neg lachman Neg sag sign Negative varus stress Negative valgus stress Negative McMurray  Gait normal  TTP to palpation of pubic symphysis and  pubic crest on right Pain over right pubic crest with activation of adductor's and rectus femoris.  Electronically signed by:  D[name] 11:52 AM 04/24/21

## 2021-04-24 NOTE — Patient Instructions (Addendum)
Do prescribed exercises at least 3x a week See you again in 4 weeks

## 2021-04-30 DIAGNOSIS — N3941 Urge incontinence: Secondary | ICD-10-CM | POA: Insufficient documentation

## 2021-05-21 NOTE — Progress Notes (Deleted)
   Aleen Sells D.[facility] [address] [place] [address] Phone: [phone]   Assessment and Plan:     There are no diagnoses linked to this encounter.  *** - Patient has received significant relief with OMT in the past.  Elects for repeat OMT today.  Tolerated well per note below. - Decision today to treat with OMT was based on Physical Exam   After verbal consent patient was treated with HVLA (high velocity low amplitude), ME (muscle energy), FPR (flex positional release), ST (soft tissue), PC/PD (Pelvic Compression/ Pelvic Decompression) techniques in cervical, rib, thoracic, lumbar, and pelvic areas. Patient tolerated the procedure well with improvement in symptoms.  Patient educated on potential side effects of soreness and recommended to rest, hydrate, and use Tylenol as needed for pain control.   Pertinent previous records reviewed include ***   Follow Up: ***     Subjective:   I, Debbe Odea, am serving as a scribe for Dr. Richardean Sale  Chief Complaint: back and neck pain   HPI:   04/24/21 Patient is a 50 year old male presenting with neck and back pain. Patient last saw Dr. Katrinka Blazing on 03/15/21 and had OMT. Today patient right knee pain is still apparent. May want steroid injection. Talk about prescriptions.  05/22/21 Patient states   Relevant Historical Information: Frequent alcohol use, multiple surgeries to right knee including medial meniscectomy  Additional pertinent review of systems negative.  Current Outpatient Medications  Medication Sig Dispense Refill   doxycycline (VIBRA-TABS) 100 MG tablet Take 1 tablet (100 mg total) by mouth 2 (two) times daily. 14 tablet 0   gabapentin (NEURONTIN) 100 MG capsule Take 2 capsules (200 mg total) by mouth at bedtime. 180 capsule 0   meloxicam (MOBIC) 15 MG tablet Take 1 tablet (15 mg total) by mouth daily. 30 tablet 0   No current facility-administered medications for this visit.       Objective:     There were no vitals filed for this visit.    There is no height or weight on file to calculate BMI.    Physical Exam:     General: Well-appearing, cooperative, sitting comfortably in no acute distress.   OMT Physical Exam:  ASIS Compression Test: Positive Right Cervical: TTP paraspinal, *** Rib: Bilateral elevated first rib with TTP Thoracic: TTP paraspinal,*** Lumbar: TTP paraspinal,*** Pelvis: Right anterior innominate  Electronically signed by:  Aleen Sells [facility] 2:27 PM 05/21/21

## 2021-05-22 ENCOUNTER — Ambulatory Visit: Payer: 59 | Admitting: Sports Medicine

## 2021-05-22 ENCOUNTER — Ambulatory Visit: Payer: 59 | Admitting: Family Medicine

## 2021-06-04 ENCOUNTER — Other Ambulatory Visit: Payer: Self-pay

## 2021-06-06 ENCOUNTER — Other Ambulatory Visit: Payer: Self-pay

## 2021-06-07 ENCOUNTER — Ambulatory Visit: Payer: 59 | Admitting: Family Medicine

## 2021-06-07 ENCOUNTER — Encounter: Payer: Self-pay | Admitting: Family Medicine

## 2021-06-07 NOTE — Progress Notes (Deleted)
Office Note 06/07/2021  CC: No chief complaint on file.  HPI:  Joshua [name] is a 50 y.o. male who is here to establish care ***. Patient's most recent primary MD: *** Old records  in EPIC/[facility] EMR were reviewed prior to or during today's visit.  No past medical history on file.  No past surgical history on file.  No family history on file.  Social History   Socioeconomic History   Marital status: Married    Spouse name: Not on file   Number of children: Not on file   Years of education: Not on file   Highest education level: Not on file  Occupational History   Not on file  Tobacco Use   Smoking status: Not on file   Smokeless tobacco: Not on file  Substance and Sexual Activity   Alcohol use: Not on file   Drug use: Not on file   Sexual activity: Not on file  Other Topics Concern   Not on file  Social History Narrative   Not on file   Social Determinants of Health   Financial Resource Strain: Not on file  Food Insecurity: Not on file  Transportation Needs: Not on file  Physical Activity: Not on file  Stress: Not on file  Social Connections: Not on file  Intimate Partner Violence: Not on file    Outpatient Encounter Medications as of 06/07/2021  Medication Sig   doxycycline (VIBRA-TABS) 100 MG tablet Take 1 tablet (100 mg total) by mouth 2 (two) times daily.   gabapentin (NEURONTIN) 100 MG capsule Take 2 capsules (200 mg total) by mouth at bedtime.   meloxicam (MOBIC) 15 MG tablet Take 1 tablet (15 mg total) by mouth daily.   mirabegron ER (MYRBETRIQ) 25 MG TB24 tablet Take by mouth.   sertraline (ZOLOFT) 100 MG tablet Take 100 mg by mouth daily.   silodosin (RAPAFLO) 8 MG CAPS capsule Take by mouth.   testosterone cypionate (DEPOTESTOSTERONE CYPIONATE) 200 MG/ML injection SMARTSIG:Milliliter(s) IM   tolterodine (DETROL) 2 MG tablet Take by mouth.   No facility-administered encounter medications on file as of 06/07/2021.    Not on  File  ROS *** PE; There were no vitals taken for this visit.  Physical Exam  *** Pertinent labs:  NONE  ASSESSMENT AND PLAN:   No problem-specific Assessment & Plan notes found for this encounter.   An After Visit Summary was printed and given to the patient.  No follow-ups on file.  Signed:  Santiago Bumpers, MD           06/07/2021

## 2021-06-12 ENCOUNTER — Telehealth: Payer: Self-pay

## 2021-06-12 NOTE — Telephone Encounter (Signed)
Pt came in office today and completed forms that were missing. He states that he was not sure when his appointment was. Please advise if pt can be rescheduled. Pt can be contacted at [phone].

## 2021-06-18 ENCOUNTER — Encounter: Payer: Self-pay | Admitting: Family Medicine

## 2021-06-18 ENCOUNTER — Other Ambulatory Visit: Payer: Self-pay

## 2021-06-18 ENCOUNTER — Ambulatory Visit: Payer: Self-pay

## 2021-06-18 ENCOUNTER — Ambulatory Visit: Payer: 59 | Admitting: Family Medicine

## 2021-06-18 VITALS — BP 136/80 | HR 71 | Ht 70.0 in | Wt 226.0 lb

## 2021-06-18 DIAGNOSIS — M1711 Unilateral primary osteoarthritis, right knee: Secondary | ICD-10-CM | POA: Diagnosis not present

## 2021-06-18 DIAGNOSIS — M533 Sacrococcygeal disorders, not elsewhere classified: Secondary | ICD-10-CM | POA: Diagnosis not present

## 2021-06-18 DIAGNOSIS — M9902 Segmental and somatic dysfunction of thoracic region: Secondary | ICD-10-CM

## 2021-06-18 DIAGNOSIS — M9904 Segmental and somatic dysfunction of sacral region: Secondary | ICD-10-CM

## 2021-06-18 DIAGNOSIS — M9903 Segmental and somatic dysfunction of lumbar region: Secondary | ICD-10-CM | POA: Diagnosis not present

## 2021-06-18 DIAGNOSIS — M9908 Segmental and somatic dysfunction of rib cage: Secondary | ICD-10-CM | POA: Diagnosis not present

## 2021-06-18 DIAGNOSIS — M9901 Segmental and somatic dysfunction of cervical region: Secondary | ICD-10-CM | POA: Diagnosis not present

## 2021-06-18 NOTE — Assessment & Plan Note (Signed)

## 2021-06-18 NOTE — Patient Instructions (Signed)
Good to see you! I will check out hard 75 Stay active but make sure you do hip flexors See you again in 7-8 weeks

## 2021-06-18 NOTE — Assessment & Plan Note (Signed)
Chronic problem with mild exacerbation.  Patient does have the gabapentin if needed.  Patient continues to do a lot of heavy lifting.  We discussed different ergonomics that I think will be beneficial.  Discussed which activities to do which wants to avoid.  Discussed with some of patient's lifting mechanics look to potentially change if possible.  Follow-up again in 6 to 8 weeks.

## 2021-06-18 NOTE — Assessment & Plan Note (Signed)
Patient's ultrasound does not show any signs of reaccumulation of the inflammation.  Discussed with patient to continue to stay active otherwise.  No significant swelling noted on ultrasound today.  Patient has been doing more deep bending again.  Discussed with patient to monitor this.  Follow-up with me again in 6 to 8 weeks

## 2021-06-18 NOTE — Progress Notes (Signed)
[name] [address] [place] [address] Phone: [phone] Subjective:   [name], am serving as a scribe for Dr. Antoine Primas. This visit occurred during the SARS-CoV-2 public health emergency.  Safety protocols were in place, including screening questions prior to the visit, additional usage of staff PPE, and extensive cleaning of exam room while observing appropriate contact time as indicated for disinfecting solutions.     CC: Right knee pain, back pain follow-up  SAY:[id number]  03/15/2021 Patient has had 5 previous surgeries on this knee.  Patient has had removal of the medial meniscus and does have fairly advanced osteoarthritic changes for his age.  Baker's cyst noted as well and attempted aspiration today with some improvement.  Discussed with patient about icing regimen and home exercises.  We will get approval for possible viscosupplementation.  Follow-up again in 6 to 8 weeks Did need to attempt aspiration to time so did give patient doxycycline in case there is any redness noted patient to start the antibiotics.  Update 06/18/2021 [place] Newkirk is a 50 y.o. male coming in with complaint of R knee and back pain. Has had OMT previously. Patient states more ROM in the knee. Feels better than last time. Back pain is worse than last time. Wants manipulation.  Patient is having some more increasing tightness recently.  Patient has been working out more and doing heavier weights.  Also notices that when he has more sugar he notices more discomfort.       Past Medical History:  Diagnosis Date   Alcoholism (HCC)    Anxiety and depression    BPH with obstruction/lower urinary tract symptoms    Chronic back pain    sports med   Chronic pain of right knee    Hypogonadism in male    OAB (overactive bladder)    urol   Stenosing tenosynovitis    R thumb and L middle finger-->good response to inj by ortho 07/2020   Past Surgical History:   Procedure Laterality Date   righ knee surgery     5+.  Meniscectomy among others   Social History   Socioeconomic History   Marital status: Married    Spouse name: Not on file   Number of children: Not on file   Years of education: Not on file   Highest education level: Not on file  Occupational History   Not on file  Tobacco Use   Smoking status: Not on file   Smokeless tobacco: Not on file  Substance and Sexual Activity   Alcohol use: Not on file   Drug use: Not on file   Sexual activity: Not on file  Other Topics Concern   Not on file  Social History Narrative   Not on file   Social Determinants of Health   Financial Resource Strain: Not on file  Food Insecurity: Not on file  Transportation Needs: Not on file  Physical Activity: Not on file  Stress: Not on file  Social Connections: Not on file   Not on File No family history on file.  Current Outpatient Medications (Endocrine & Metabolic):    testosterone cypionate (DEPOTESTOSTERONE CYPIONATE) 200 MG/ML injection, SMARTSIG:Milliliter(s) IM    Current Outpatient Medications (Analgesics):    meloxicam (MOBIC) 15 MG tablet, Take 1 tablet (15 mg total) by mouth daily.   Current Outpatient Medications (Other):    doxycycline (VIBRA-TABS) 100 MG tablet, Take 1 tablet (100 mg total) by mouth 2 (two) times  daily.   gabapentin (NEURONTIN) 100 MG capsule, Take 2 capsules (200 mg total) by mouth at bedtime.   mirabegron ER (MYRBETRIQ) 25 MG TB24 tablet, Take by mouth.   sertraline (ZOLOFT) 100 MG tablet, Take 100 mg by mouth daily.   silodosin (RAPAFLO) 8 MG CAPS capsule, Take by mouth.   tolterodine (DETROL) 2 MG tablet, Take by mouth.   Reviewed prior external information including notes and imaging from  primary care provider As well as notes that were available from care everywhere and other healthcare systems.  Past medical history, social, surgical and family history all reviewed in electronic medical  record.  No pertanent information unless stated regarding to the chief complaint.   Review of Systems:  No headache, visual changes, nausea, vomiting, diarrhea, constipation, dizziness, abdominal pain, skin rash, fevers, chills, night sweats, weight loss, swollen lymph nodes, body aches, joint swelling, chest pain, shortness of breath, mood changes. POSITIVE muscle aches  Objective  Blood pressure 136/80, pulse 71, height 5\' 10"  (1.778 m), weight 226 lb (102.5 kg), SpO2 97 %.   General: No apparent distress alert and oriented x3 mood and affect normal, dressed appropriately.  HEENT: Pupils equal, extraocular movements intact  Respiratory: Patient's speak in full sentences and does not appear short of breath  Cardiovascular: No lower extremity edema, non tender, no erythema  Gait normal with good balance and coordination.  MSK: Right knee exam shows improvement in range of motion.  Lacks the last 2 degrees of flexion and lacks 2 degrees of extension.  Still some mild instability noted with valgus and varus force. Low back exam has slight loss of lordosis.  Tightness noted of the hip flexors right greater than left.  Tightness with FABER test on the right greater than left as well.  Limited muscular skeletal ultrasound was performed and interpreted by , M  Limited ultrasound of patient's right knee does show some arthritic changes but no significant swelling.  No reaccumulation of the Baker's cyst Impression: Knee arthritis but no significant worsening of anything.  Osteopathic findings  C6 flexed rotated and side bent left T3 extended rotated and side bent right inhaled third rib T8 extended rotated and side bent left L2 flexed rotated and side bent right Sacrum right on right    Impression and Recommendations:     The above documentation has been reviewed and is accurate and complete Antoine Primas, DO

## 2021-06-19 ENCOUNTER — Encounter: Payer: Self-pay | Admitting: Family Medicine

## 2021-06-19 NOTE — Telephone Encounter (Signed)
Noted  

## 2021-06-19 NOTE — Telephone Encounter (Signed)
After discussing with Dr. Milinda Cave with details in regards to this appointment.  Dr. Milinda Cave does not want to allow rescheduling of patient's new patient appt.  Patient verbally spoke to three different employees of our office regarding his new patient appt, 1 of those were the day before his appt with [facility] pre-screening and confirmation of time and date.  Here are appt notes copied and pasted:   $New Patient / Est PCP / moved here from [place] / mailed npp - pt will mail back forms LVM to callback(NPP) vjw Pt to bring in asap, at lastest thursday pt brought in npp on 06/03/21, pt needs to complete DPR sheet & medical record sheet before appointment on 06/07/21--KR. Pt completed pre-screening--KR   Danford Bad, please send discharge letter. Thank you.

## 2021-06-19 NOTE — Telephone Encounter (Signed)
Please assist patient with scheduling, thanks. 

## 2021-06-19 NOTE — Telephone Encounter (Signed)
Dr. Milinda Cave, Can we reschedule new pt visit? It appears pt no showed 12/16.

## 2021-06-19 NOTE — Telephone Encounter (Signed)
Yes okay 

## 2021-06-19 NOTE — Telephone Encounter (Signed)
I agree

## 2021-06-20 ENCOUNTER — Encounter: Payer: Self-pay | Admitting: Family Medicine

## 2021-06-20 NOTE — Telephone Encounter (Signed)
Printed dismissal letter

## 2021-07-31 NOTE — Progress Notes (Signed)
[name] [address] [place] [address] Phone: [phone] Subjective:   Joshua [name], am serving as a scribe for Dr. Antoine Primas. This visit occurred during the SARS-CoV-2 public health emergency.  Safety protocols were in place, including screening questions prior to the visit, additional usage of staff PPE, and extensive cleaning of exam room while observing appropriate contact time as indicated for disinfecting solutions.   I'm seeing this patient by the request  of:  Pcp, No  CC: Right knee worsening left knee, back pain  [id number]  06/18/2021 Patient's ultrasound does not show any signs of reaccumulation of the inflammation.  Discussed with patient to continue to stay active otherwise.  No significant swelling noted on ultrasound today.  Patient has been doing more deep bending again.  Discussed with patient to monitor this.  Follow-up with me again in 6 to 8 weeks  Chronic problem with mild exacerbation.  Patient does have the gabapentin if needed.  Patient continues to do a lot of heavy lifting.  We discussed different ergonomics that I think will be beneficial.  Discussed which activities to do which wants to avoid.  Discussed with some of patient's lifting mechanics look to potentially change if possible.  Follow-up again in 6 to 8 weeks.  OMT as well   Updated 08/01/2021 Joshua [name] is a 51 y.o. male coming in with complaint of B knee and back pain. Back pain has increased but feels that OMT will help his pain. Other complaints is L knee. Experiencing clicking and popping. History of ACL repair 3 years ago.        Past Medical History:  Diagnosis Date   Alcoholism (HCC)    Anxiety and depression    BPH with obstruction/lower urinary tract symptoms    Chronic back pain    sports med   Chronic pain of right knee    Chronic pain of right knee    multiple surgeries.  Baker's cyst aspirated/injected by Dr. Katrinka Blazing 2022-->good  results   Hypogonadism in male    OAB (overactive bladder)    urol   Stenosing tenosynovitis    R thumb and L middle finger-->good response to inj by ortho 07/2020   Past Surgical History:  Procedure Laterality Date   righ knee surgery     5+.  Meniscectomy among others   Social History   Socioeconomic History   Marital status: Married    Spouse name: Not on file   Number of children: Not on file   Years of education: Not on file   Highest education level: Not on file  Occupational History   Not on file  Tobacco Use   Smoking status: Not on file   Smokeless tobacco: Not on file  Substance and Sexual Activity   Alcohol use: Not on file   Drug use: Not on file   Sexual activity: Not on file  Other Topics Concern   Not on file  Social History Narrative   Not on file   Social Determinants of Health   Financial Resource Strain: Not on file  Food Insecurity: Not on file  Transportation Needs: Not on file  Physical Activity: Not on file  Stress: Not on file  Social Connections: Not on file   Not on File No family history on file.  Current Outpatient Medications (Endocrine & Metabolic):    testosterone cypionate (DEPOTESTOSTERONE CYPIONATE) 200 MG/ML injection, SMARTSIG:Milliliter(s) IM    Current Outpatient Medications (Analgesics):  meloxicam (MOBIC) 15 MG tablet, Take 1 tablet (15 mg total) by mouth daily.   Current Outpatient Medications (Other):    doxycycline (VIBRA-TABS) 100 MG tablet, Take 1 tablet (100 mg total) by mouth 2 (two) times daily.   gabapentin (NEURONTIN) 100 MG capsule, Take 2 capsules (200 mg total) by mouth at bedtime.   mirabegron ER (MYRBETRIQ) 25 MG TB24 tablet, Take by mouth.   sertraline (ZOLOFT) 100 MG tablet, Take 100 mg by mouth daily.   silodosin (RAPAFLO) 8 MG CAPS capsule, Take by mouth.   tolterodine (DETROL) 2 MG tablet, Take by mouth.   Reviewed prior external information including notes and imaging from  primary care  provider As well as notes that were available from care everywhere and other healthcare systems.  Past medical history, social, surgical and family history all reviewed in electronic medical record.  No pertanent information unless stated regarding to the chief complaint.   Review of Systems:  No headache, visual changes, nausea, vomiting, diarrhea, constipation, dizziness, abdominal pain, skin rash, fevers, chills, night sweats, weight loss, swollen lymph nodes, body aches, joint swelling, chest pain, shortness of breath, mood changes. POSITIVE muscle aches  Objective  Blood pressure 132/68, pulse 71, height 5\' 10"  (1.778 m), weight 215 lb (97.5 kg), SpO2 98 %.   General: No apparent distress alert and oriented x3 mood and affect normal, dressed appropriately.  HEENT: Pupils equal, extraocular movements intact  Respiratory: Patient's speak in full sentences and does not appear short of breath  Cardiovascular: No lower extremity edema, non tender, no erythema  Gait normal with good balance and coordination.  MSK: Patient's right knee does have arthritic changes.  Instability with valgus and varus force, fullness noted in the popliteal area that is consistent with a Baker's cyst.  Trace effusion noted of the patellofemoral joint bilaterally  Low back exam with significant tightness noted.  Lacks the last 5 degrees of extension.  Tightness with FABER test bilaterally.  Tenderness to palpation of the paraspinal musculature.  Procedure: Real-time Ultrasound Guided Injection of right knee aspiration Device: GE Logiq Q7 Ultrasound guided injection is preferred based studies that show increased duration, increased effect, greater accuracy, decreased procedural pain, increased response rate, and decreased cost with ultrasound guided versus blind injection.  Verbal informed consent obtained.  Time-out conducted.  Noted no overlying erythema, induration, or other signs of local infection.  Skin  prepped in a sterile fashion.  Local anesthesia: Topical Ethyl chloride.  With sterile technique and under real time ultrasound guidance: With a 21-gauge 2 inch needle patient was injected with 0.5 cc of 0.5% Marcaine and aspirated 25 cc of straw-colored colored fluid from the posterior approach.  And then injected 1 cc Kenalog 40 mg/mL. Completed without difficulty  Pain immediately resolved suggesting accurate placement of the medication.  Advised to call if fevers/chills, erythema, induration, drainage, or persistent bleeding.  Impression: Technically successful ultrasound guided injection.   Osteopathic findings C2 flexed rotated and side bent right C6 flexed rotated and side bent left T3 extended rotated and side bent right inhaled third rib T4 extended rotated and side bent left L2 flexed rotated and side bent right L5 flexed rotated and side bent left Sacrum right on right    Impression and Recommendations:     The above documentation has been reviewed and is accurate and complete , DO

## 2021-08-01 ENCOUNTER — Other Ambulatory Visit: Payer: Self-pay

## 2021-08-01 ENCOUNTER — Ambulatory Visit: Payer: Self-pay

## 2021-08-01 ENCOUNTER — Ambulatory Visit: Payer: 59 | Admitting: Family Medicine

## 2021-08-01 VITALS — BP 132/68 | HR 71 | Ht 70.0 in | Wt 215.0 lb

## 2021-08-01 DIAGNOSIS — M533 Sacrococcygeal disorders, not elsewhere classified: Secondary | ICD-10-CM

## 2021-08-01 DIAGNOSIS — M9901 Segmental and somatic dysfunction of cervical region: Secondary | ICD-10-CM

## 2021-08-01 DIAGNOSIS — M9908 Segmental and somatic dysfunction of rib cage: Secondary | ICD-10-CM

## 2021-08-01 DIAGNOSIS — G8929 Other chronic pain: Secondary | ICD-10-CM

## 2021-08-01 DIAGNOSIS — M9904 Segmental and somatic dysfunction of sacral region: Secondary | ICD-10-CM | POA: Diagnosis not present

## 2021-08-01 DIAGNOSIS — M9903 Segmental and somatic dysfunction of lumbar region: Secondary | ICD-10-CM

## 2021-08-01 DIAGNOSIS — M9902 Segmental and somatic dysfunction of thoracic region: Secondary | ICD-10-CM

## 2021-08-01 DIAGNOSIS — M1711 Unilateral primary osteoarthritis, right knee: Secondary | ICD-10-CM

## 2021-08-01 DIAGNOSIS — M25562 Pain in left knee: Secondary | ICD-10-CM | POA: Diagnosis not present

## 2021-08-01 NOTE — Patient Instructions (Addendum)
Drained knee today Keep your appt

## 2021-08-02 NOTE — Assessment & Plan Note (Signed)
Patient had aspiration done.  Tolerated the procedure well, discussed icing regimen and home exercises.  Discussed which activities to do and which ones to avoid.  Patient of course wants to avoid any type of surgical intervention.  Could be a potential candidate for viscosupplementation.  Follow-up again in 6 to 8 weeks

## 2021-08-02 NOTE — Assessment & Plan Note (Signed)
Chronic, with exacerbation.  Patient continues to remain active.  Patient has been given meloxicam and gabapentin.  We discussed what they did and the mechanism of action.  Patient continues to be active.  Has a another wrestling tournament coming up.  Follow-up again in 6 to 8 weeks.

## 2021-08-05 NOTE — Progress Notes (Signed)
Joshua [name] [place] [address] [place] Phone: [phone] Subjective:   Joshua [name], am serving as a scribe for Dr. Hulan Saas. This visit occurred during the SARS-CoV-2 public health emergency.  Safety protocols were in place, including screening questions prior to the visit, additional usage of staff PPE, and extensive cleaning of exam room while observing appropriate contact time as indicated for disinfecting solutions.   I'm seeing this patient by the request  of:  Pcp, No  CC: Back pain and knee pain follow-up  [id number]  Joshua [name] is a 51 y.o. male coming in with complaint of back and neck pain. OMT on 06/18/2021. Just seen last week for knee pain. Patient states same per usual. Nothing new to speak about.  Patient does have knee arthritis. Instability noted.  We did have aspiration of the knee done on the right side that is improving.  Continues to have some discomfort.  Medications patient has been prescribed: None  Taking:         Reviewed prior external information including notes and imaging from previsou exam, outside providers and external EMR if available.   As well as notes that were available from care everywhere and other healthcare systems.  Past medical history, social, surgical and family history all reviewed in electronic medical record.  No pertanent information unless stated regarding to the chief complaint.   Past Medical History:  Diagnosis Date   Alcoholism ([place])    Anxiety and depression    BPH with obstruction/lower urinary tract symptoms    Chronic back pain    sports med   Chronic pain of right knee    Chronic pain of right knee    multiple surgeries.  Baker's cyst aspirated/injected by Dr. Tamala Julian 2022-->good results   Hypogonadism in male    OAB (overactive bladder)    urol   Stenosing tenosynovitis    R thumb and L middle finger-->good response to inj by ortho 07/2020    Not on  File   Review of Systems:  No headache, visual changes, nausea, vomiting, diarrhea, constipation, dizziness, abdominal pain, skin rash, fevers, chills, night sweats, weight loss, swollen lymph nodes, body aches, joint swelling, chest pain, shortness of breath, mood changes. POSITIVE muscle aches  Objective  Blood pressure 118/76, pulse 68, height 5\' 10"  (1.778 m), SpO2 97 %.   General: No apparent distress alert and oriented x3 mood and affect normal, dressed appropriately.  HEENT: Pupils equal, extraocular movements intact  Respiratory: Patient's speak in full sentences and does not appear short of breath  Cardiovascular: No lower extremity edema, non tender, no erythema  Right knee exam shows trace effusion noted.  Lacks last 5 degrees of flexion.  The patient does have some instability with valgus and varus force.  Left knee does have some patellofemoral grinding noted.         Assessment and Plan:  Degenerative arthritis of knee, bilateral Viscosupplementation given bilaterally.  Chronic problem with exacerbation.  Has knee arthritis.  Patient wants to avoid any type of surgical intervention if possible.  Patient is still trying to compete.  Follow-up with me again in 4 to 6 weeks         The above documentation has been reviewed and is accurate and complete Lyndal Pulley, DO        Note: This dictation was prepared with Dragon dictation along with smaller phrase technology. Any transcriptional errors that result from this process are  unintentional.

## 2021-08-06 ENCOUNTER — Ambulatory Visit (INDEPENDENT_AMBULATORY_CARE_PROVIDER_SITE_OTHER): Payer: 59 | Admitting: Family Medicine

## 2021-08-06 ENCOUNTER — Encounter: Payer: Self-pay | Admitting: Family Medicine

## 2021-08-06 ENCOUNTER — Other Ambulatory Visit: Payer: Self-pay

## 2021-08-06 DIAGNOSIS — M170 Bilateral primary osteoarthritis of knee: Secondary | ICD-10-CM | POA: Diagnosis not present

## 2021-08-06 NOTE — Patient Instructions (Addendum)
[place] for knees today Will take a month to work See me in 4-6 weeks

## 2021-08-06 NOTE — Assessment & Plan Note (Signed)
Viscosupplementation given bilaterally.  Chronic problem with exacerbation.  Has knee arthritis.  Patient wants to avoid any type of surgical intervention if possible.  Patient is still trying to compete.  Follow-up with me again in 4 to 6 weeks

## 2021-08-07 ENCOUNTER — Telehealth: Payer: Self-pay

## 2021-08-07 NOTE — Telephone Encounter (Signed)
No should be fine  Ice 20 minutes 2 times daily. Usually after activity and before bed. Send update tomorrow

## 2021-08-07 NOTE — Telephone Encounter (Signed)
Spoke with patient per recommendations.  

## 2021-08-20 NOTE — Progress Notes (Signed)
?[name] D.O. ?[facility] ?[address] [place] [address] ?Phone: [phone] ?Subjective:   ?I, [name], am serving as a Neurosurgeon for Dr. Antoine Primas. ?This visit occurred during the SARS-CoV-2 public health emergency.  Safety protocols were in place, including screening questions prior to the visit, additional usage of staff PPE, and extensive cleaning of exam room while observing appropriate contact time as indicated for disinfecting solutions.  ? ?I'm seeing this patient by the request  of:  Pcp, No ? ?CC: Knee pain, neck pain and back pain follow-up ? ?[id number]  ?Joshua [name] is a 52 y.o. male coming in with complaint of back and neck pain. OMT on 08/01/2021. Patient states knees are feeling worse than before. Feels unstable. ? ?Patient was also seen and given the bilateral viscosupplementation of the knees.  The patient after the injections did have an acute exacerbation of the left knee.  Patient did call us about this for 1 day but we have not heard since then.  Patient states  ? ?Medications patient has been prescribed: None ? ? ? ? ?  ? ? ? ? ?Reviewed prior external information including notes and imaging from previsou exam, outside providers and external EMR if available.  ? ?As well as notes that were available from care everywhere and other healthcare systems. ? ?Past medical history, social, surgical and family history all reviewed in electronic medical record.  No pertanent information unless stated regarding to the chief complaint.  ? ?Past Medical History:  ?Diagnosis Date  ? Alcoholism (HCC)   ? Anxiety and depression   ? BPH with obstruction/lower urinary tract symptoms   ? Chronic back pain   ? sports med  ? Chronic pain of right knee   ? Chronic pain of right knee   ? multiple surgeries.  Baker's cyst aspirated/injected by Dr. Katrinka Blazing 2022-->good results  ? Hypogonadism in male   ? OAB (overactive bladder)   ? urol  ? Stenosing tenosynovitis   ? R thumb and L  middle finger-->good response to inj by ortho 07/2020  ?  ?Not on File ? ? ?Review of Systems: ? No headache, visual changes, nausea, vomiting, diarrhea, constipation, dizziness, abdominal pain, skin rash, fevers, chills, night sweats, weight loss, swollen lymph nodes, body aches, joint swelling, chest pain, shortness of breath, mood changes. POSITIVE muscle aches ? ?Objective  ?Blood pressure 126/72, pulse 66, height 5\' 10"  (1.778 m), weight 210 lb (95.3 kg), SpO2 97 %. ?  ?General: No apparent distress alert and oriented x3 mood and affect normal, dressed appropriately.  ?HEENT: Pupils equal, extraocular movements intact  ?Respiratory: Patient's speak in full sentences and does not appear short of breath  ?Cardiovascular: No lower extremity edema, non tender, no erythema  ?Neck exam shows some limited range of motion bilaterally.  Tightness noted with sidebending.  Negative Spurling's noted.  Tightness noted in the right parascapular region.  Patient has very mild scapular dyskinesis.  Tightness of the shoulder girdles bilaterally.  Low back tender to palpation over the sacroiliac joint.  More tightness with FABER test on the right greater than left.  Bilateral knees do have arthritic changes noted with crepitus.  Mostly of the patellofemoral joint.  Patient having difficulty even with walking at the moment.  No increase in instability but patient is having difficulty with range of motion especially with the right knee with flexion. ? ?Osteopathic findings ? ?T5 extended rotated and side bent left ?L2 flexed rotated and side bent  right ?Sacrum right on right ? ? ?Very quick limited musculoskeletal ultrasound that was not safe today did show the patient does have reaccumulation of the Baker's cyst on the right knee, patient does have nonspecific mild effusion of the patellofemoral joint bilaterally. ? ?  ?Assessment and Plan: ? ?Degenerative arthritis of knee, bilateral ?Patient is having worsening pain at this  point.  Patient has reaccumulation of the Baker's cyst on the right side with a quick ultrasound.  Patient does not have any true swelling though of the knees and cells.  No sign of any infectious etiology.  Patient though does seem to be in significantly more pain at the moment.  Patient is also x-rays compared to his ultrasound is not corresponding with the amount of arthritis.  Patient's arthritis on x-ray seem to be mild but patient has had numerous surgeries of the knees.  In addition of this patient on his ultrasound does appear to have significant narrowing especially of the medial compartment of the right knee.  At this point I do feel that advanced imaging is warranted.  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 ?Patient would consider the possibility of surgical intervention if needed.  Follow-up with me again after imaging to discuss further. ? ?SI (sacroiliac) joint dysfunction ?Exacerbated secondary to patient's knee and likely a locking.  Did respond extremely well though to osteopathic manipulation.  Focused on the patient's lower back today. ?  ? ?Nonallopathic problems ? ?Decision today to treat with OMT was based on Physical Exam ? ?After verbal consent patient was treated with HVLA, ME, FPR techniques in  thoracic, lumbar, and sacral  areas ? ?Patient tolerated the procedure well with improvement in symptoms ? ?Patient given exercises, stretches and lifestyle modifications ? ?See medications in patient instructions if given ? ?Patient will follow up in 4-8 weeks ? ?  ? ? ?The above documentation has been reviewed and is accurate and complete [name], DO ? ? ? ?  ? ? Note: This dictation was prepared with Dragon dictation along with smaller phrase technology. Any transcriptional errors that result from this process are unintentional.    ?  ?  ? ?

## 2021-08-21 ENCOUNTER — Other Ambulatory Visit: Payer: Self-pay

## 2021-08-21 ENCOUNTER — Ambulatory Visit: Payer: 59 | Admitting: Family Medicine

## 2021-08-21 ENCOUNTER — Ambulatory Visit (INDEPENDENT_AMBULATORY_CARE_PROVIDER_SITE_OTHER): Payer: 59

## 2021-08-21 VITALS — BP 126/72 | HR 66 | Ht 70.0 in | Wt 210.0 lb

## 2021-08-21 DIAGNOSIS — M170 Bilateral primary osteoarthritis of knee: Secondary | ICD-10-CM

## 2021-08-21 DIAGNOSIS — M9902 Segmental and somatic dysfunction of thoracic region: Secondary | ICD-10-CM

## 2021-08-21 DIAGNOSIS — M9904 Segmental and somatic dysfunction of sacral region: Secondary | ICD-10-CM

## 2021-08-21 DIAGNOSIS — M9903 Segmental and somatic dysfunction of lumbar region: Secondary | ICD-10-CM

## 2021-08-21 DIAGNOSIS — M533 Sacrococcygeal disorders, not elsewhere classified: Secondary | ICD-10-CM

## 2021-08-21 IMAGING — DX DG KNEE AP/LAT W/ SUNRISE*L*
3 series · 3 of 3 positions shown · non-contrast
Comparison: None.

CLINICAL DATA: Knee pain. Recent flare-up of left knee pain in last
2 weeks.

EXAM:
LEFT KNEE 3 VIEWS

[knee ap]
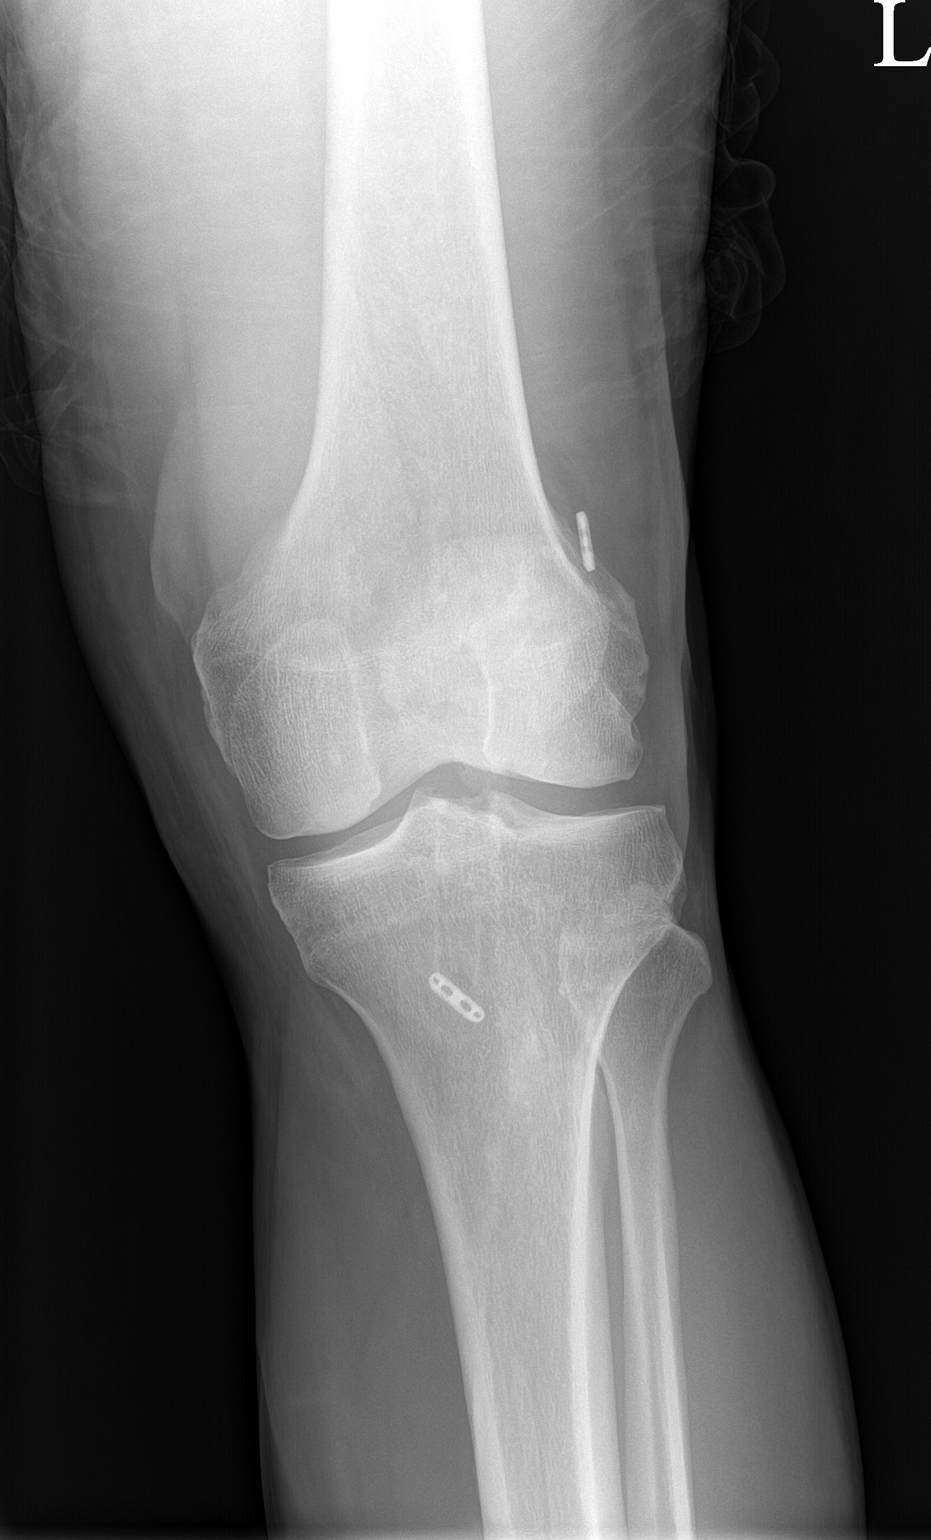

[knee lat]
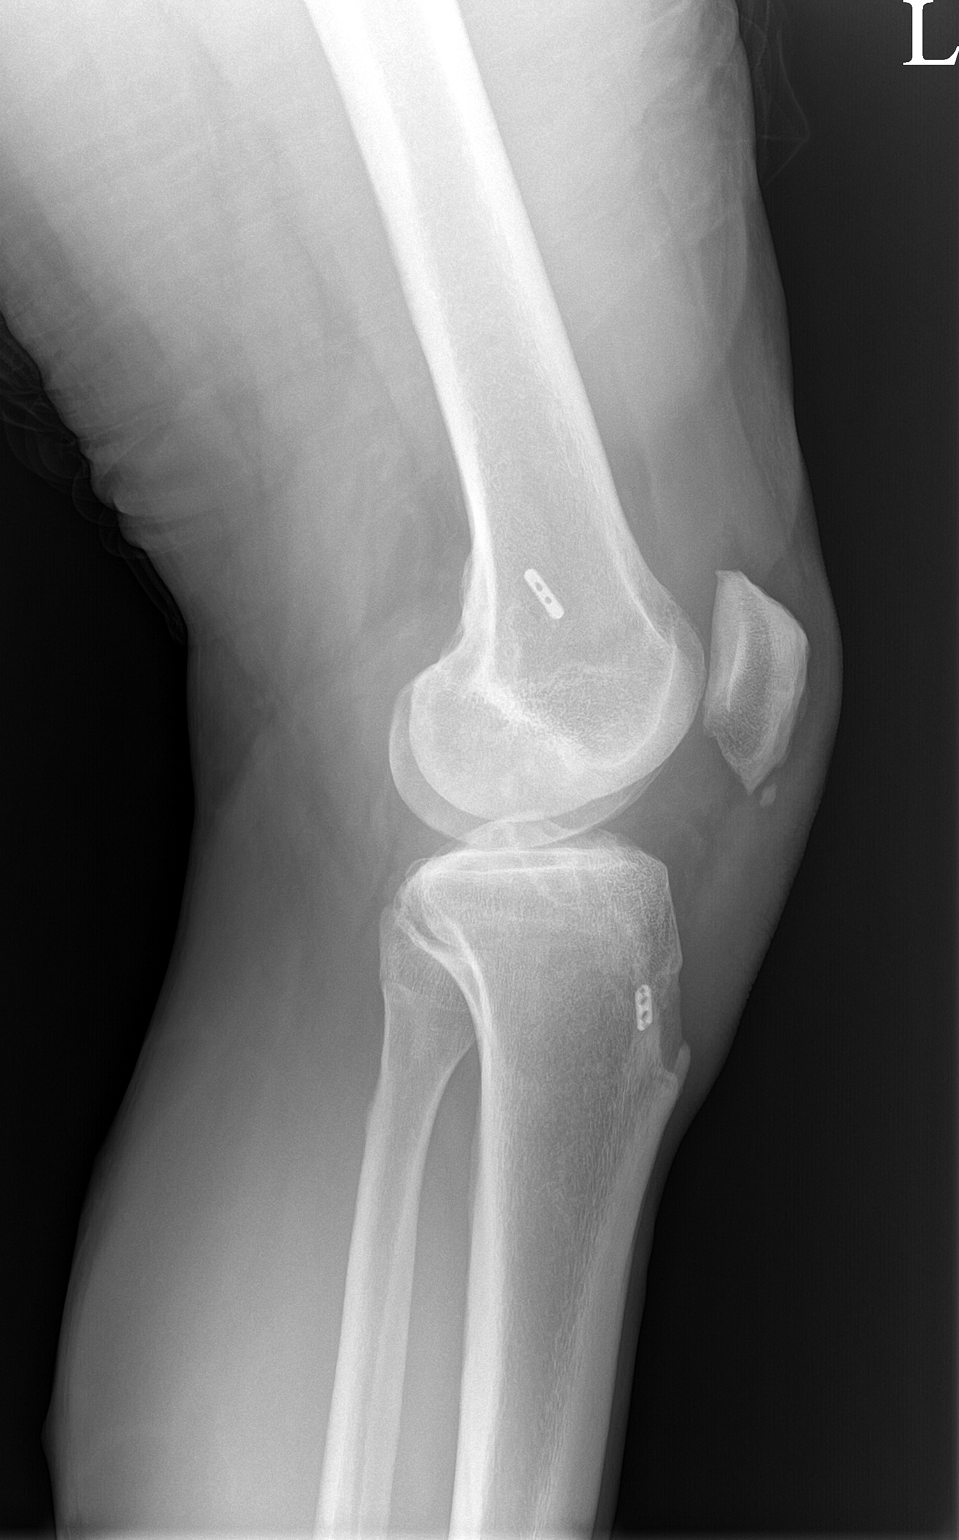

[patella]
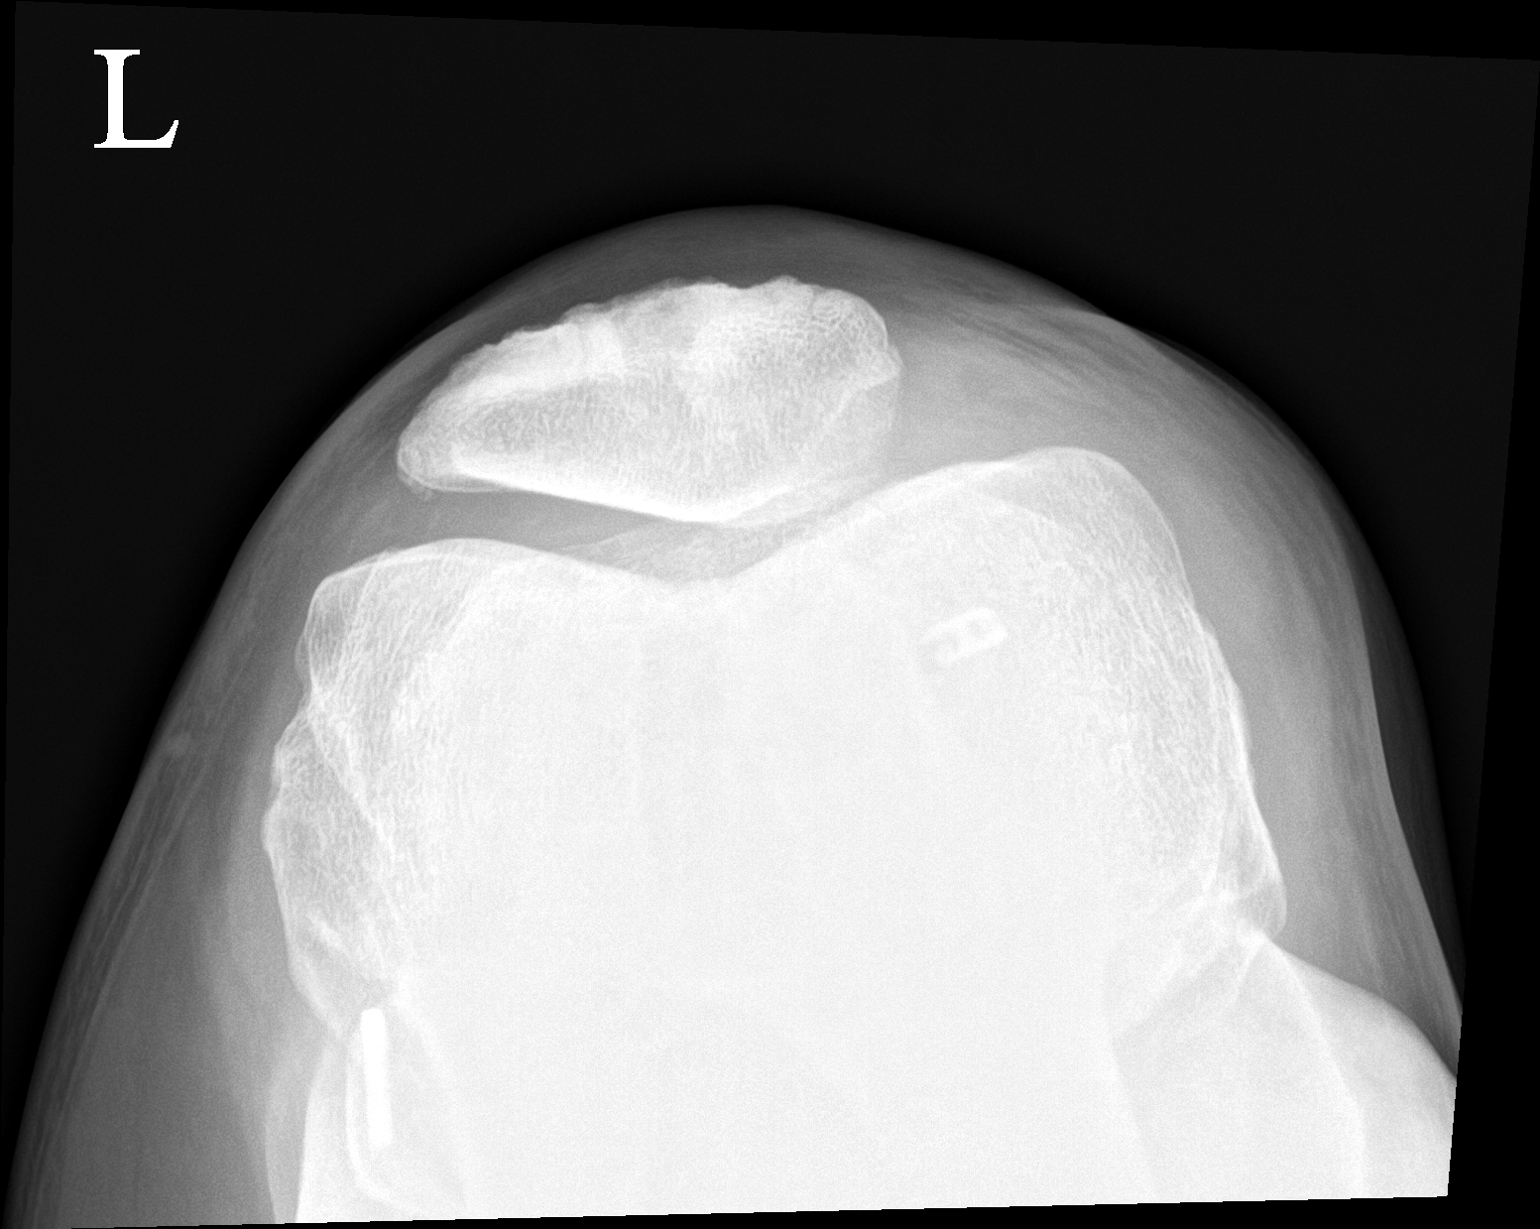

[3 of 3 positions shown; findings below may reference images not displayed]

FINDINGS: Postsurgical changes are seen of prior ACL reconstruction. Moderate
joint effusion. Mild spurring at the patellar tendon insertion on
the tibial tubercle. Mild enthesopathic change at the patellar
tendon origin at the patella. Mild superior and inferior patellar
degenerative osteophytes. Joint spaces are preserved. No acute
fracture or dislocation.
IMPRESSION: :
IMPRESSION: 1. Status post ACL reconstruction.
2. Enthesopathic changes at the proximal and distal patellar tendon.
3. Moderate joint effusion.

## 2021-08-21 NOTE — Patient Instructions (Signed)
Lets figure out these knees  ?Lets get xray of the left knee ?Then MRI of both knees and we will try [place] to get it done sooner ?I will write you in my chart and tell you next move or suggestion  ? ?

## 2021-08-21 NOTE — Assessment & Plan Note (Signed)
Exacerbated secondary to patient's knee and likely a locking.  Did respond extremely well though to osteopathic manipulation.  Focused on the patient's lower back today. ?

## 2021-08-21 NOTE — Assessment & Plan Note (Signed)
Patient is having worsening pain at this point.  Patient has reaccumulation of the Baker's cyst on the right side with a quick ultrasound.  Patient does not have any true swelling though of the knees and cells.  No sign of any infectious etiology.  Patient though does seem to be in significantly more pain at the moment.  Patient is also x-rays compared to his ultrasound is not corresponding with the amount of arthritis.  Patient's arthritis on x-ray seem to be mild but patient has had numerous surgeries of the knees.  In addition of this patient on his ultrasound does appear to have significant narrowing especially of the medial compartment of the right knee.  At this point I do feel that advanced imaging is warranted.  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 ?Patient would consider the possibility of surgical intervention if needed.  Follow-up with me again after imaging to discuss further. ?

## 2021-08-28 ENCOUNTER — Ambulatory Visit: Payer: 59 | Admitting: Family Medicine

## 2021-08-28 ENCOUNTER — Other Ambulatory Visit: Payer: Self-pay

## 2021-08-28 DIAGNOSIS — M25562 Pain in left knee: Secondary | ICD-10-CM

## 2021-08-28 DIAGNOSIS — G8929 Other chronic pain: Secondary | ICD-10-CM

## 2021-08-28 DIAGNOSIS — M25561 Pain in right knee: Secondary | ICD-10-CM

## 2021-08-28 DIAGNOSIS — M170 Bilateral primary osteoarthritis of knee: Secondary | ICD-10-CM

## 2021-08-31 ENCOUNTER — Other Ambulatory Visit: Payer: 59

## 2021-08-31 ENCOUNTER — Other Ambulatory Visit: Payer: Self-pay

## 2021-08-31 ENCOUNTER — Ambulatory Visit (INDEPENDENT_AMBULATORY_CARE_PROVIDER_SITE_OTHER): Payer: 59

## 2021-08-31 DIAGNOSIS — M25562 Pain in left knee: Secondary | ICD-10-CM | POA: Diagnosis not present

## 2021-08-31 DIAGNOSIS — M170 Bilateral primary osteoarthritis of knee: Secondary | ICD-10-CM | POA: Diagnosis not present

## 2021-08-31 DIAGNOSIS — M25561 Pain in right knee: Secondary | ICD-10-CM

## 2021-08-31 DIAGNOSIS — G8929 Other chronic pain: Secondary | ICD-10-CM

## 2021-08-31 IMAGING — MR MR KNEE*L* W/O CM
7 series · 40 of 40 positions shown · non-contrast
Comparison: None.

CLINICAL DATA: Left knee pain for 3 weeks

EXAM:
MRI OF THE LEFT KNEE WITHOUT CONTRAST
TECHNIQUE: Multiplanar, multisequence MR imaging of the knee was performed. No
intravenous contrast was administered.

[Series 3: T2 fat-sat · axial · 4.0mm · 0.62mm/px · z∈[-75,+95]mm · 6 of 35 slices shown (1 of 3)]
[im 1/35]
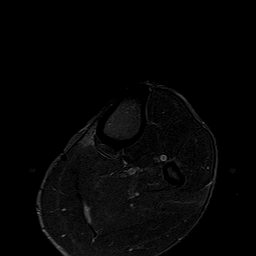
[im 7/35]
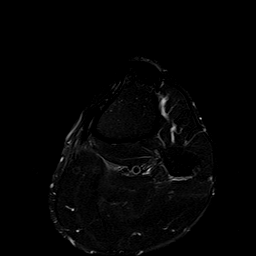
[im 14/35]
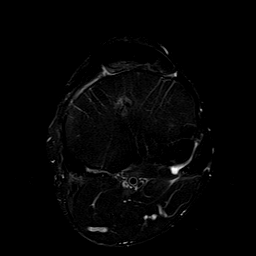
[im 21/35]
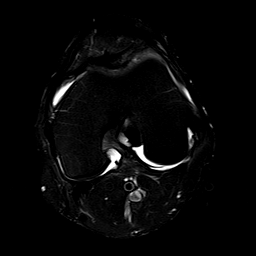
[im 28/35]
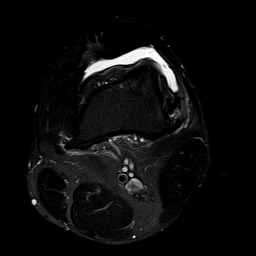
[im 35/35]
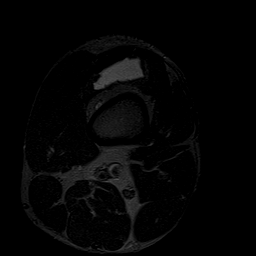

[Series 4: T1 · coronal · 4.0mm · 0.59mm/px · 6 of 31 slices shown]
[im 1/31]
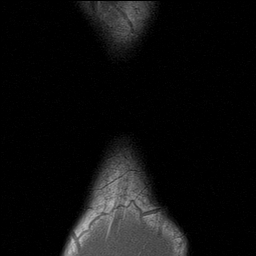
[im 7/31]
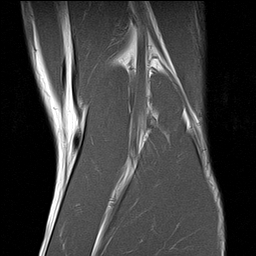
[im 13/31]
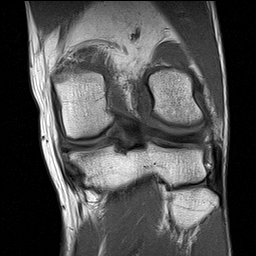
[im 19/31]
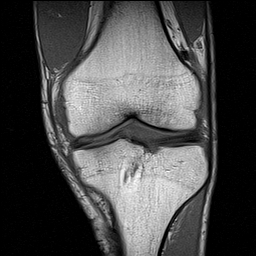
[im 25/31]
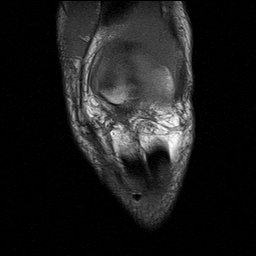
[im 31/31]
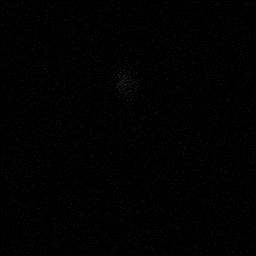

[Series 5: T2 fat-sat · coronal · 4.0mm · 0.59mm/px · 6 of 31 slices shown (2 of 3)]
[im 1/31]
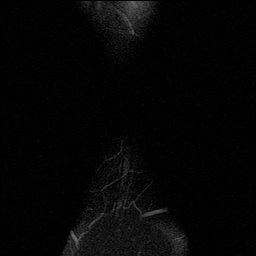
[im 7/31]
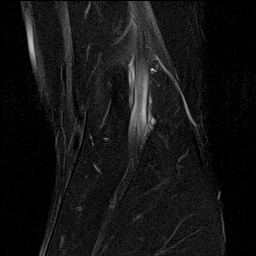
[im 13/31]
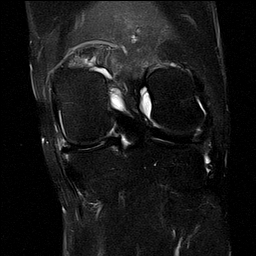
[im 19/31]
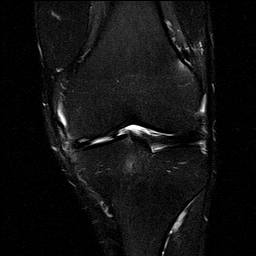
[im 25/31]
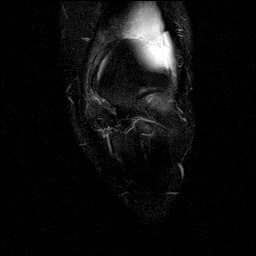
[im 31/31]
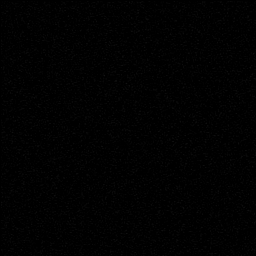

[Series 6: PD fat-sat · coronal · 4.0mm · 0.59mm/px · 6 of 31 slices shown (1 of 3)]
[im 1/31]
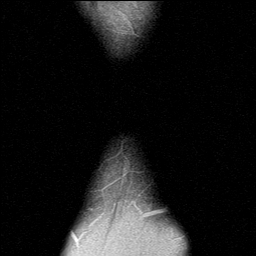
[im 7/31]
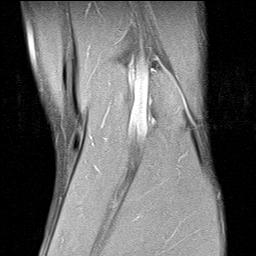
[im 13/31]
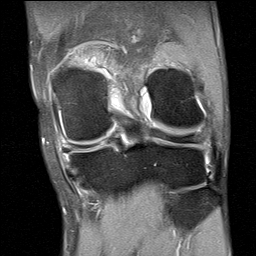
[im 19/31]
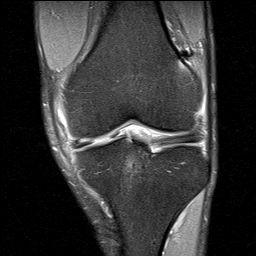
[im 25/31]
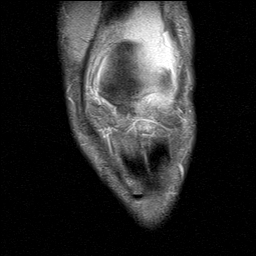
[im 31/31]
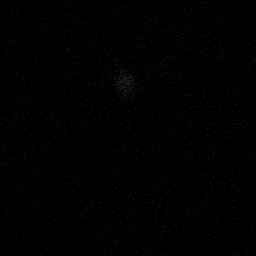

[Series 7: PD fat-sat · sagittal · 3.0mm · 0.59mm/px · 6 of 32 slices shown (2 of 3)]
[im 1/32]
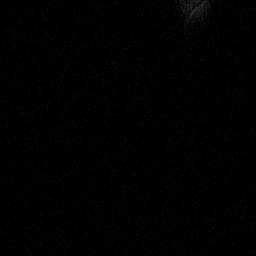
[im 7/32]
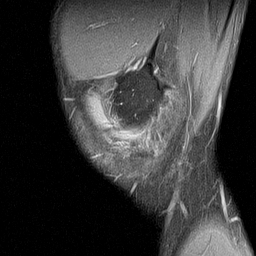
[im 13/32]
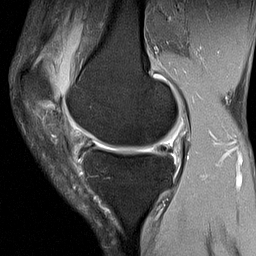
[im 19/32]
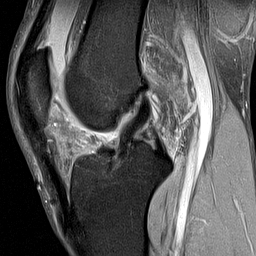
[im 25/32]
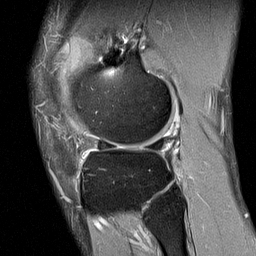
[im 32/32]
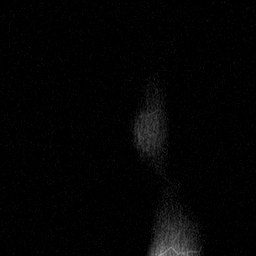

[Series 8: T2 fat-sat · sagittal · 3.0mm · 0.59mm/px · 6 of 32 slices shown (3 of 3)]
[im 1/32]
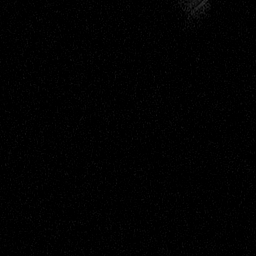
[im 7/32]
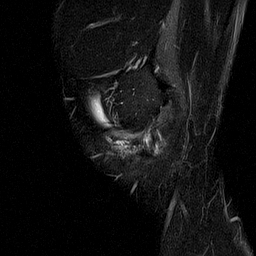
[im 13/32]
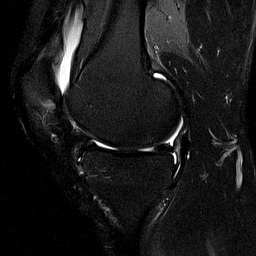
[im 19/32]
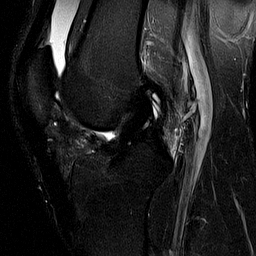
[im 25/32]
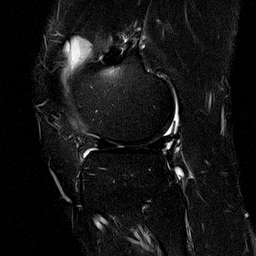
[im 32/32]
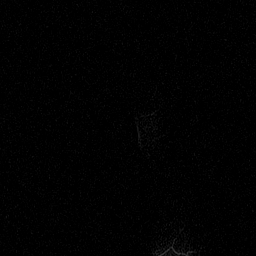

[Series 9: PD fat-sat · oblique · 2.0mm · 0.59mm/px · 4 of 19 slices shown (3 of 3)]
[im 1/19]
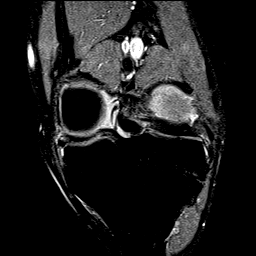
[im 7/19]
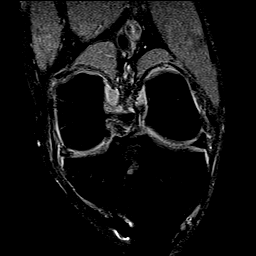
[im 13/19]
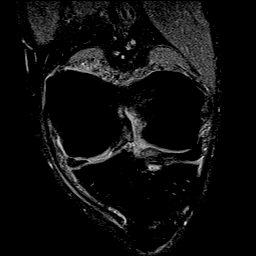
[im 19/19]
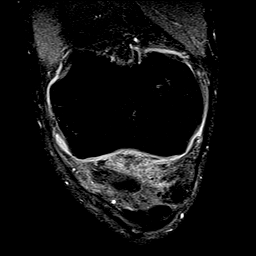

[40 of 40 positions shown; findings below may reference images not displayed]

FINDINGS: MENISCI

Medial: Complex tear of the posterior horn and body of the medial
meniscus with peripheral meniscal extrusion.

Lateral: Intact.

LIGAMENTS

Cruciates: Prior ACL repair with the ACL graft intact. ACL graft as
a wavy appearance suggesting an element of laxity. Correlate with
clinical exam. Intact PCL.

Collaterals: Medial collateral ligament is intact. Lateral
collateral ligament complex is intact.

CARTILAGE

Patellofemoral:  No chondral defect.

Medial: High-grade partial-thickness cartilage loss of the medial
femorotibial compartment.

Lateral:  No chondral defect.

JOINT: Large joint effusion. Edema in Hoffa's fat. Prior medial
patellar plica resection.

POPLITEAL FOSSA: Popliteus tendon is intact. No Baker's cyst.

EXTENSOR MECHANISM: Intact quadriceps tendon. Intact patellar
tendon. Intact lateral patellar retinaculum. Intact medial patellar
retinaculum. Intact MPFL.

BONES: No aggressive osseous lesion. No fracture or dislocation.

Other: No fluid collection or hematoma. Muscles are normal.
IMPRESSION: 1. Complex tear of the posterior horn and body of the medial
meniscus with peripheral meniscal extrusion.
2. Prior ACL repair with the ACL graft intact. ACL graft as a wavy
appearance suggesting an element of laxity. Correlate with clinical
exam.
3. High-grade partial-thickness cartilage loss of the medial
femorotibial compartment consistent with moderate-severe
osteoarthritis.
4. Large joint effusion.

## 2021-08-31 IMAGING — MR MR KNEE*R* W/O CM
7 series · 40 of 40 positions shown · non-contrast
Comparison: None.

CLINICAL DATA: Right knee pain.

EXAM:
MRI OF THE RIGHT KNEE WITHOUT CONTRAST
TECHNIQUE: Multiplanar, multisequence MR imaging of the knee was performed. No
intravenous contrast was administered.

[Series 3: T2 fat-sat · axial · 4.0mm · 0.62mm/px · z∈[-70,+100]mm · 6 of 35 slices shown (1 of 3)]
[im 1/35]
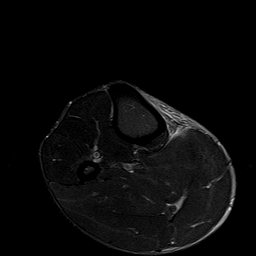
[im 7/35]
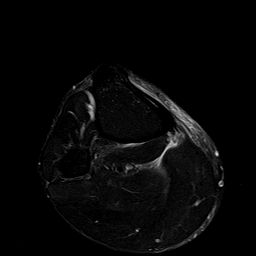
[im 14/35]
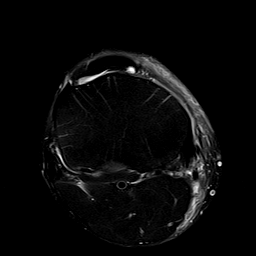
[im 21/35]
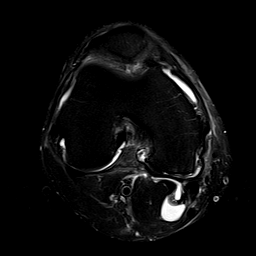
[im 28/35]
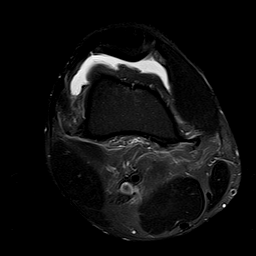
[im 35/35]
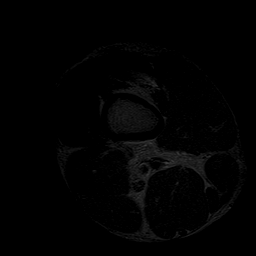

[Series 4: T1 · coronal · 4.0mm · 0.59mm/px · 5 of 29 slices shown]
[im 1/29]
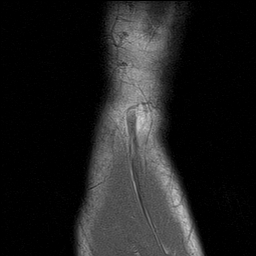
[im 8/29]
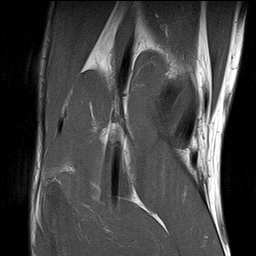
[im 15/29]
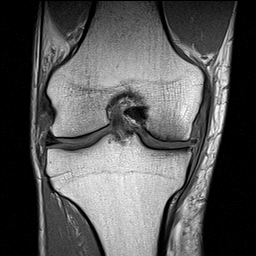
[im 22/29]
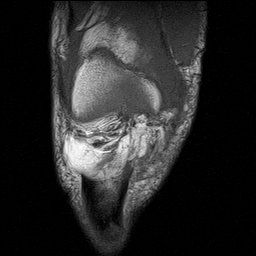
[im 29/29]
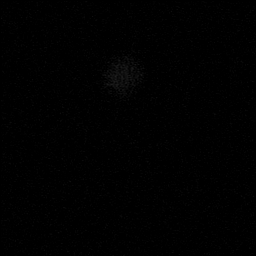

[Series 5: T2 fat-sat · coronal · 4.0mm · 0.59mm/px · 5 of 29 slices shown (2 of 3)]
[im 1/29]
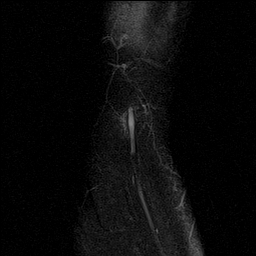
[im 8/29]
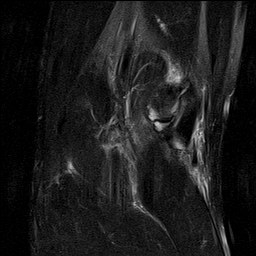
[im 15/29]
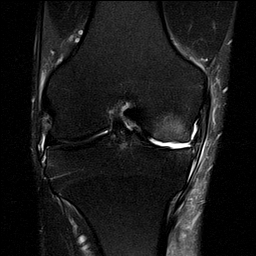
[im 22/29]
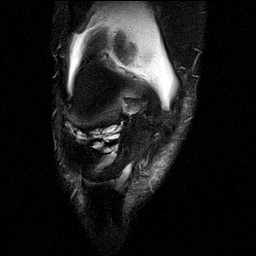
[im 29/29]
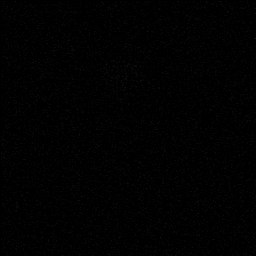

[Series 6: PD fat-sat · coronal · 4.0mm · 0.59mm/px · 6 of 29 slices shown (1 of 3)]
[im 1/29]
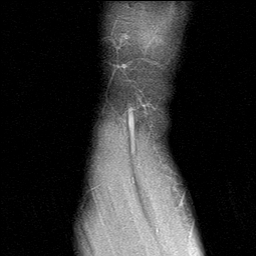
[im 6/29]
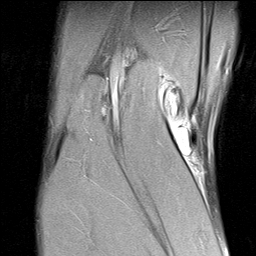
[im 12/29]
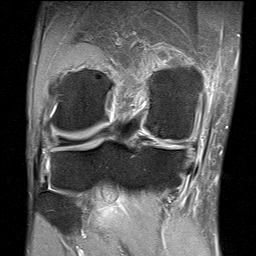
[im 17/29]
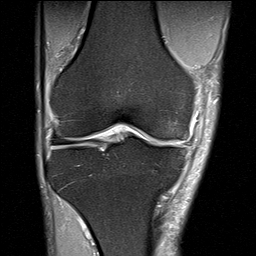
[im 23/29]
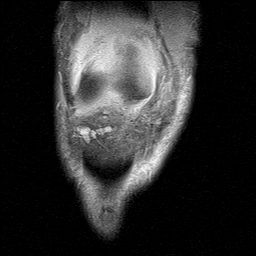
[im 29/29]
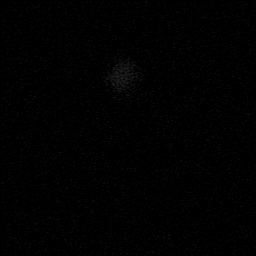

[Series 7: PD fat-sat · sagittal · 3.0mm · 0.59mm/px · 7 of 34 slices shown (2 of 3)]
[im 1/34]
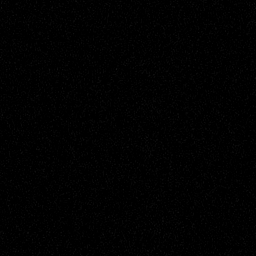
[im 6/34]
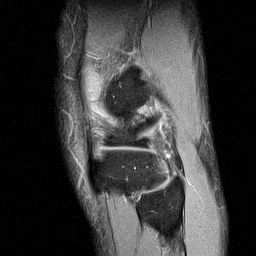
[im 12/34]
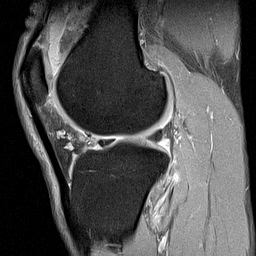
[im 17/34]
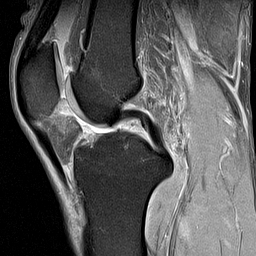
[im 23/34]
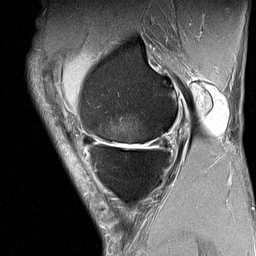
[im 28/34]
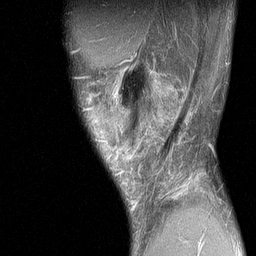
[im 34/34]
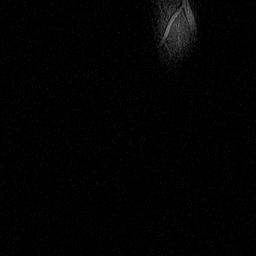

[Series 8: T2 fat-sat · sagittal · 3.0mm · 0.59mm/px · 7 of 34 slices shown (3 of 3)]
[im 1/34]
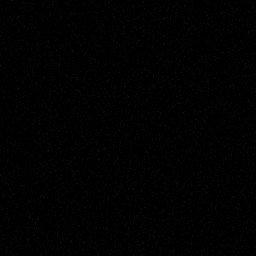
[im 6/34]
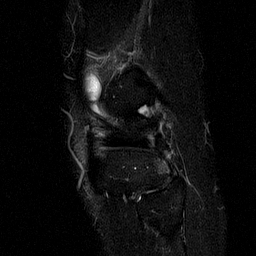
[im 12/34]
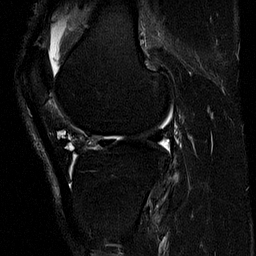
[im 17/34]
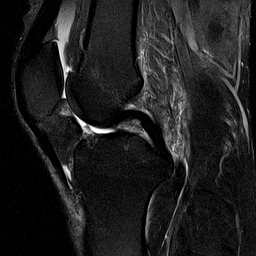
[im 23/34]
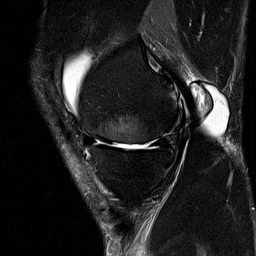
[im 28/34]
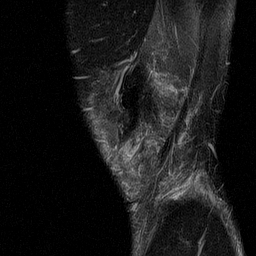
[im 34/34]
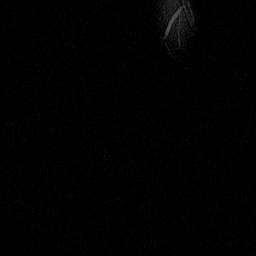

[Series 9: PD fat-sat · coronal · 2.0mm · 0.59mm/px · 4 of 19 slices shown (3 of 3)]
[im 1/19]
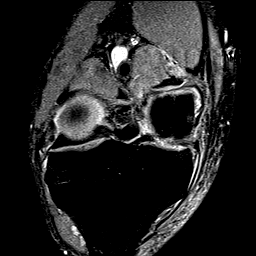
[im 7/19]
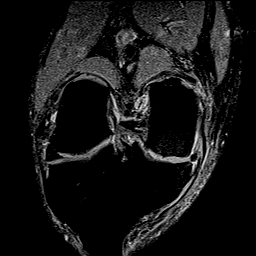
[im 13/19]
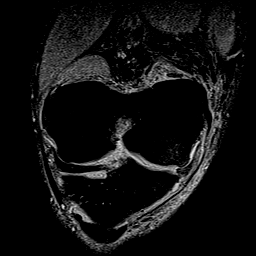
[im 19/19]
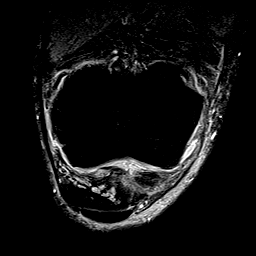

[40 of 40 positions shown; findings below may reference images not displayed]

FINDINGS: MENISCI

Medial: Large radial tear versus meniscectomy of the body of the
medial meniscus. Complex tear of the posterior horn of the medial
meniscus.

Lateral: Intact.

LIGAMENTS

Cruciates: ACL and PCL are intact.

Collaterals: Medial collateral ligament is intact. Lateral
collateral ligament complex is intact.

CARTILAGE

Patellofemoral:  No chondral defect.

Medial: Extensive full-thickness cartilage loss of the medial
femorotibial compartment with subchondral reactive marrow edema.

Lateral:  No chondral defect.

JOINT: Large joint effusion. Numerous tubular structures in Hoffa's
fat likely reflecting prominent vessels from a vascular
malformation. No plical thickening.

POPLITEAL FOSSA: Popliteus tendon is intact. Small leaking Baker's
cyst.

EXTENSOR MECHANISM: Intact quadriceps tendon. Intact patellar
tendon. Intact lateral patellar retinaculum. Intact medial patellar
retinaculum. Intact MPFL.

BONES: No aggressive osseous lesion. No fracture or dislocation.

Other: No fluid collection or hematoma. Muscles are normal.
IMPRESSION: 1. Large radial tear versus meniscectomy of the body of the medial
meniscus. Complex tear of the posterior horn of the medial meniscus.
2. Extensive full-thickness cartilage loss of the medial
femorotibial compartment with subchondral reactive marrow edema
consistent with severe osteoarthritis.
3. Large joint effusion.

## 2021-09-04 NOTE — Progress Notes (Signed)
?Joshua [name] D.O. ?Joshua [name] ?[address] [place] [address] ?Phone: [phone] ?Subjective:   ?I, Joshua [name], am serving as a scribe for Dr. Antoine Primas. ? ?This visit occurred during the SARS-CoV-2 public health emergency.  Safety protocols were in place, including screening questions prior to the visit, additional usage of staff PPE, and extensive cleaning of exam room while observing appropriate contact time as indicated for disinfecting solutions.  ? ? ?I'm seeing this patient by the request  of:  Pcp, No ? ?CC: Bilateral knee pain ? ?YOV:[id number]  ?08/21/2021 ?Patient is having worsening pain at this point.  Patient has reaccumulation of the Baker's cyst on the right side with a quick ultrasound.  Patient does not have any true swelling though of the knees and cells.  No sign of any infectious etiology.  Patient though does seem to be in significantly more pain at the moment.  Patient is also x-rays compared to his ultrasound is not corresponding with the amount of arthritis.  Patient's arthritis on x-ray seem to be mild but patient has had numerous surgeries of the knees.  In addition of this patient on his ultrasound does appear to have significant narrowing especially of the medial compartment of the right knee.  At this point I do feel that advanced imaging is warranted.  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 ?Patient would consider the possibility of surgical intervention if needed.  Follow-up with me again after imaging to discuss further. ? ?Update 09/10/2021 ?Joshua [name] is a 51 y.o. male coming in with complaint of B knee pain. Patient states doing better, but not the best. Pain is less intense. No new complaints.  Patient did have MRI showing the patient did have severe neural compartment arthritic changes in the knee effusion bilaterally.  Does have degenerative tearing of the meniscus noted but seems secondary to  the amount of arthritic changes ? ? ?  ? ?Past Medical History:  ?Diagnosis Date  ? Alcoholism ([facility])   ? Anxiety and depression   ? BPH with obstruction/lower urinary tract symptoms   ? Chronic back pain   ? sports med  ? Chronic pain of right knee   ? Chronic pain of right knee   ? multiple surgeries.  Baker's cyst aspirated/injected by Dr. Katrinka Blazing 2022-->good results  ? Hypogonadism in male   ? OAB (overactive bladder)   ? urol  ? Stenosing tenosynovitis   ? R thumb and L middle finger-->good response to inj by ortho 07/2020  ? ?Past Surgical History:  ?Procedure Laterality Date  ? righ knee surgery    ? 5+.  Meniscectomy among others  ? ?Social History  ? ?Socioeconomic History  ? Marital status: Married  ?  Spouse name: Not on file  ? Number of children: Not on file  ? Years of education: Not on file  ? Highest education level: Not on file  ?Occupational History  ? Not on file  ?Tobacco Use  ? Smoking status: Not on file  ? Smokeless tobacco: Not on file  ?Substance and Sexual Activity  ? Alcohol use: Not on file  ? Drug use: Not on file  ? Sexual activity: Not on file  ?Other Topics Concern  ? Not on file  ?Social History Narrative  ? Not on file  ? ?Social Determinants of Health  ? ?Financial Resource Strain: Not on file  ?Food Insecurity: Not on file  ?  Transportation Needs: Not on file  ?Physical Activity: Not on file  ?Stress: Not on file  ?Social Connections: Not on file  ? ?Not on File ?No family history on file. ? ?Current Outpatient Medications (Endocrine & Metabolic):  ?  testosterone cypionate (DEPOTESTOSTERONE CYPIONATE) 200 MG/ML injection, SMARTSIG:Milliliter(s) IM ? ? ? ?Current Outpatient Medications (Analgesics):  ?  meloxicam (MOBIC) 15 MG tablet, Take 1 tablet (15 mg total) by mouth daily. ? ? ?Current Outpatient Medications (Other):  ?  doxycycline (VIBRA-TABS) 100 MG tablet, Take 1 tablet (100 mg total) by mouth 2 (two) times daily. ?  gabapentin (NEURONTIN) 100 MG capsule, Take 2 capsules (200  mg total) by mouth at bedtime. ?  mirabegron ER (MYRBETRIQ) 25 MG TB24 tablet, Take by mouth. ?  sertraline (ZOLOFT) 100 MG tablet, Take 100 mg by mouth daily. ?  tolterodine (DETROL) 2 MG tablet, Take by mouth. ? ? ?Reviewed prior external information including notes and imaging from  ?primary care provider ?As well as notes that were available from care everywhere and other healthcare systems. ? ?Past medical history, social, surgical and family history all reviewed in electronic medical record.  No pertanent information unless stated regarding to the chief complaint.  ? ?Review of Systems: ? No headache, visual changes, nausea, vomiting, diarrhea, constipation, dizziness, abdominal pain, skin rash, fevers, chills, night sweats, weight loss, swollen lymph nodes, body aches, joint swelling, chest pain, shortness of breath, mood changes. POSITIVE muscle aches ? ?Objective  ?Blood pressure 126/76, pulse 68, height 5\' 10"  (1.778 m), SpO2 97 %. ?  ?General: No apparent distress alert and oriented x3 mood and affect normal, dressed appropriately.  ?HEENT: Pupils equal, extraocular movements intact  ?Respiratory: Patient's speak in full sentences and does not appear short of breath  ?Cardiovascular: No lower extremity edema, non tender, no erythema  ?Gait normal with good balance and coordination.  ?MSK: Knee exam bilaterally does have some trace effusion noted.  Does have tightness noted with flexion bilaterally.  Tender to palpation over the medial joint space ? ?After informed  verbal consent, patient was seated on exam table. Right knee was prepped with alcohol swab and utilizing anterolateral approach, patient's right knee space was injected with 4:1  marcaine 0.5%: Kenalog 40mg /dL. Patient tolerated the procedure well without immediate complications. ? ?After informed  verbal consent, patient was seated on exam table. Left knee was prepped with alcohol swab and utilizing anterolateral approach, patient's left knee  space was injected with 4:1  marcaine 0.5%: Kenalog 40mg /dL. Patient tolerated the procedure well without immediate complications. ? ?  ?Impression and Recommendations:  ?  ? ?The above documentation has been reviewed and is accurate and complete , DO ? ? ? ?

## 2021-09-10 ENCOUNTER — Other Ambulatory Visit: Payer: Self-pay

## 2021-09-10 ENCOUNTER — Other Ambulatory Visit: Payer: 59

## 2021-09-10 ENCOUNTER — Ambulatory Visit: Payer: Self-pay

## 2021-09-10 ENCOUNTER — Ambulatory Visit (INDEPENDENT_AMBULATORY_CARE_PROVIDER_SITE_OTHER): Payer: 59 | Admitting: Family Medicine

## 2021-09-10 VITALS — BP 126/76 | HR 68 | Ht 70.0 in

## 2021-09-10 DIAGNOSIS — M170 Bilateral primary osteoarthritis of knee: Secondary | ICD-10-CM

## 2021-09-10 NOTE — Patient Instructions (Addendum)
Good to see you! ?Injections today ?Hope it calms down we can do this every 10 weeks ?See you again in 10 weeks for knees, 3 weeks for manipulation if needed ?K2 228mcg daily for 1 month ?

## 2021-09-10 NOTE — Assessment & Plan Note (Signed)
Bilateral injections given today, tolerated the procedure well.  MRIs only showed that there was some effusion noted bilaterally.  Patient does have severe arthritic changes of the medial compartment bilaterally that likely will need replacement in the relatively near future.  Discussed different things such as potential PRP.  Increase activity slowly.  Follow-up with me again in 6 to 8 weeks otherwise. ?

## 2021-09-17 ENCOUNTER — Ambulatory Visit: Payer: 59 | Admitting: Family Medicine

## 2021-10-01 ENCOUNTER — Ambulatory Visit: Payer: 59 | Admitting: Family Medicine

## 2021-10-15 NOTE — Progress Notes (Deleted)
Joshua [name] Phone: [phone] Subjective:    I'm seeing this patient by the request  of:  Pcp, No  CC:   [id number]  09/10/2021 Bilateral injections given today, tolerated the procedure well.  MRIs only showed that there was some effusion noted bilaterally.  Patient does have severe arthritic changes of the medial compartment bilaterally that likely will need replacement in the relatively near future.  Discussed different things such as potential PRP.  Increase activity slowly.  Follow-up with me again in 6 to 8 weeks otherwise.  Update 10/16/2021 Joshua [name] is a 51 y.o. male coming in with complaint of B knee pain. Patient states       Past Medical History:  Diagnosis Date   Alcoholism ([facility])    Anxiety and depression    BPH with obstruction/lower urinary tract symptoms    Chronic back pain    sports med   Chronic pain of right knee    Chronic pain of right knee    multiple surgeries.  Baker's cyst aspirated/injected by Dr. Tamala Julian 2022-->good results   Hypogonadism in male    OAB (overactive bladder)    urol   Stenosing tenosynovitis    R thumb and L middle finger-->good response to inj by ortho 07/2020   Past Surgical History:  Procedure Laterality Date   righ knee surgery     5+.  Meniscectomy among others   Social History   Socioeconomic History   Marital status: Married    Spouse name: Not on file   Number of children: Not on file   Years of education: Not on file   Highest education level: Not on file  Occupational History   Not on file  Tobacco Use   Smoking status: Not on file   Smokeless tobacco: Not on file  Substance and Sexual Activity   Alcohol use: Not on file   Drug use: Not on file   Sexual activity: Not on file  Other Topics Concern   Not on file  Social History Narrative   Not on file   Social Determinants of Health   Financial Resource Strain: Not on file  Food  Insecurity: Not on file  Transportation Needs: Not on file  Physical Activity: Not on file  Stress: Not on file  Social Connections: Not on file   Not on File No family history on file.  Current Outpatient Medications (Endocrine & Metabolic):    testosterone cypionate (DEPOTESTOSTERONE CYPIONATE) 200 MG/ML injection, SMARTSIG:Milliliter(s) IM    Current Outpatient Medications (Analgesics):    meloxicam (MOBIC) 15 MG tablet, Take 1 tablet (15 mg total) by mouth daily.   Current Outpatient Medications (Other):    doxycycline (VIBRA-TABS) 100 MG tablet, Take 1 tablet (100 mg total) by mouth 2 (two) times daily.   gabapentin (NEURONTIN) 100 MG capsule, Take 2 capsules (200 mg total) by mouth at bedtime.   mirabegron ER (MYRBETRIQ) 25 MG TB24 tablet, Take by mouth.   sertraline (ZOLOFT) 100 MG tablet, Take 100 mg by mouth daily.   Reviewed prior external information including notes and imaging from  primary care provider As well as notes that were available from care everywhere and other healthcare systems.  Past medical history, social, surgical and family history all reviewed in electronic medical record.  No pertanent information unless stated regarding to the chief complaint.   Review of Systems:  No headache, visual changes, nausea, vomiting, diarrhea, constipation, dizziness, abdominal  pain, skin rash, fevers, chills, night sweats, weight loss, swollen lymph nodes, body aches, joint swelling, chest pain, shortness of breath, mood changes. POSITIVE muscle aches  Objective  There were no vitals taken for this visit.   General: No apparent distress alert and oriented x3 mood and affect normal, dressed appropriately.  HEENT: Pupils equal, extraocular movements intact  Respiratory: Patient's speak in full sentences and does not appear short of breath  Cardiovascular: No lower extremity edema, non tender, no erythema  Gait normal with good balance and coordination.  MSK:  Non  tender with full range of motion and good stability and symmetric strength and tone of shoulders, elbows, wrist, hip, knee and ankles bilaterally.     Impression and Recommendations:     The above documentation has been reviewed and is accurate and complete Jacqualin Combes

## 2021-10-16 ENCOUNTER — Ambulatory Visit: Payer: 59 | Admitting: Family Medicine

## 2021-10-16 ENCOUNTER — Ambulatory Visit (INDEPENDENT_AMBULATORY_CARE_PROVIDER_SITE_OTHER): Payer: 59 | Admitting: Sports Medicine

## 2021-10-16 VITALS — BP 138/78 | HR 43 | Ht 70.0 in | Wt 210.0 lb

## 2021-10-16 DIAGNOSIS — M9902 Segmental and somatic dysfunction of thoracic region: Secondary | ICD-10-CM

## 2021-10-16 DIAGNOSIS — M9905 Segmental and somatic dysfunction of pelvic region: Secondary | ICD-10-CM | POA: Diagnosis not present

## 2021-10-16 DIAGNOSIS — M170 Bilateral primary osteoarthritis of knee: Secondary | ICD-10-CM | POA: Diagnosis not present

## 2021-10-16 DIAGNOSIS — M545 Low back pain, unspecified: Secondary | ICD-10-CM | POA: Diagnosis not present

## 2021-10-16 DIAGNOSIS — M9901 Segmental and somatic dysfunction of cervical region: Secondary | ICD-10-CM | POA: Diagnosis not present

## 2021-10-16 DIAGNOSIS — G8929 Other chronic pain: Secondary | ICD-10-CM

## 2021-10-16 DIAGNOSIS — M9903 Segmental and somatic dysfunction of lumbar region: Secondary | ICD-10-CM | POA: Diagnosis not present

## 2021-10-16 DIAGNOSIS — M9908 Segmental and somatic dysfunction of rib cage: Secondary | ICD-10-CM

## 2021-10-16 NOTE — Progress Notes (Signed)
? Joshua [name] Joshua [name] ?[facility] ?[address] [place] [address] ?Phone: [phone] ?  ?Assessment and Plan:   ?  ?1. Primary osteoarthritis of both knees ?-Chronic, improving, subsequent visit ?- Overall improvement in swelling and pain in bilateral knees after CSI at last office visit on 09/10/2021.  Patient has been able to restart physical activity with current level of relief ?- Plan for additional CSI as needed, with goal of waiting 3 months between injections ?- Patient had a negative reaction to HA injections ?- If CSI injections become ineffective in the future, could consider PRP versus referral to orthopedic surgery for surgical intervention discussion ? ?2. Chronic right-sided low back pain without sciatica ?3. Somatic dysfunction of cervical region ?4. Somatic dysfunction of thoracic region ?5. Somatic dysfunction of lumbar region ?6. Somatic dysfunction of pelvic region ?7. Somatic dysfunction of rib region ?-Chronic with exacerbation, subsequent visit ?- Recurrence of multiple musculoskeletal complaints with most prominent being right lower back without red flag symptoms ?- Patient has received significant relief with OMT in the past.  Elects for repeat OMT today.  Tolerated well per note below. ?- Decision today to treat with OMT was based on Physical Exam ?  ?After verbal consent patient was treated with HVLA (high velocity low amplitude), ME (muscle energy), FPR (flex positional release), ST (soft tissue), PC/PD (Pelvic Compression/ Pelvic Decompression) techniques in cervical, rib, thoracic, lumbar, and pelvic areas. Patient tolerated the procedure well with improvement in symptoms.  Patient educated on potential side effects of soreness and recommended to rest, hydrate, and use Tylenol as needed for pain control. ?  ?Pertinent previous records reviewed include none ?  ?Follow Up: 3 to 4 weeks to review benefit of CSI for knees as well as repeat OMT if needed ?   ?Subjective:   ?I, Joshua [name], am serving as a Neurosurgeon for Doctor [employer] ? ?Chief Complaint: OMT follow up  ? ?HPI:  ?08/21/2021 ?Patient is having worsening pain at this point.  Patient has reaccumulation of the Baker's cyst on the right side with a quick ultrasound.  Patient does not have any true swelling though of the knees and cells.  No sign of any infectious etiology.  Patient though does seem to be in significantly more pain at the moment.  Patient is also x-rays compared to his ultrasound is not corresponding with the amount of arthritis.  Patient's arthritis on x-ray seem to be mild but patient has had numerous surgeries of the knees.  In addition of this patient on his ultrasound does appear to have significant narrowing especially of the medial compartment of the right knee.  At this point I do feel that advanced imaging is warranted.  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 ?Patient would consider the possibility of surgical intervention if needed.  Follow-up with me again after imaging to discuss further. ?  ?Update 09/10/2021 ?Joshua [name] is a 51 y.o. male coming in with complaint of B knee pain. Patient states doing better, but not the best. Pain is less intense. No new complaints.  Patient did have MRI showing the patient did have severe neural compartment arthritic changes in the knee effusion bilaterally.  Does have degenerative tearing of the meniscus noted but seems secondary to the amount of arthritic changes ? ?10/16/21 ?Patient states that he is sore, gel injections had a reaction last visit had CSI and the knee are starting  to come around just stared training again last week  ? ?Relevant Historical Information: Bilateral knee OA ? ?Additional pertinent review of systems negative. ? ?Current Outpatient Medications  ?Medication Sig Dispense Refill  ? doxycycline (VIBRA-TABS) 100 MG tablet Take 1 tablet (100 mg total) by mouth  2 (two) times daily. 14 tablet 0  ? gabapentin (NEURONTIN) 100 MG capsule Take 2 capsules (200 mg total) by mouth at bedtime. 180 capsule 0  ? meloxicam (MOBIC) 15 MG tablet Take 1 tablet (15 mg total) by mouth daily. 30 tablet 0  ? mirabegron ER (MYRBETRIQ) 25 MG TB24 tablet Take by mouth.    ? sertraline (ZOLOFT) 100 MG tablet Take 100 mg by mouth daily.    ? testosterone cypionate (DEPOTESTOSTERONE CYPIONATE) 200 MG/ML injection SMARTSIG:Milliliter(s) IM    ? ?No current facility-administered medications for this visit.  ?  ?  ?Objective:   ?  ?Vitals:  ? 10/16/21 1117  ?BP: 138/78  ?Pulse: (!) 43  ?SpO2: 98%  ?Weight: 210 lb (95.3 kg)  ?Height: 5\' 10"  (1.778 m)  ?  ?  ?Body mass index is 30.13 kg/m?.  ?  ?Physical Exam:   ?  ?General: Well-appearing, cooperative, sitting comfortably in no acute distress.  ? ?OMT Physical Exam: ? ?ASIS Compression Test: Positive Right ?Cervical: TTP paraspinal, C3-5 RRSR ?Rib: Left elevated first rib with mild TTP ?Thoracic: TTP paraspinal, T7 RRSR ?Lumbar: TTP paraspinal, L5 rotated left, L1-3 RRSL ?Pelvis: Right anterior innominate ? ?Electronically signed by:  ? D.Joshua [name] ?[facility] ?11:53 AM 10/16/21 ?

## 2021-10-16 NOTE — Patient Instructions (Addendum)
Good to see you  3-4 week follow up  

## 2021-11-13 NOTE — Progress Notes (Unsigned)
Joshua [name] [address] [place] [address] Phone: [phone] Subjective:   Joshua [name], am serving as a scribe for Dr. Antoine Primas.  I'm seeing this patient by the request  of:  Pcp, No  CC: Knee pain, back pain follow-up  FWY:OVZCHYIFOY  Joshua [name] is a 51 y.o. male coming in with complaint of back and neck pain. OMT 10/16/2021. Also f/u for B knee pain. Patient states  same per usual. Left knee is doing better. No new complaints.   Medications patient has been prescribed: None  Taking:         Reviewed prior external information including notes and imaging from previsou exam, outside providers and external EMR if available.   As well as notes that were available from care everywhere and other healthcare systems.  Past medical history, social, surgical and family history all reviewed in electronic medical record.  No pertanent information unless stated regarding to the chief complaint.   Past Medical History:  Diagnosis Date   Alcoholism (HCC)    Anxiety and depression    BPH with obstruction/lower urinary tract symptoms    Chronic back pain    sports med   Chronic pain of right knee    Chronic pain of right knee    multiple surgeries.  Baker's cyst aspirated/injected by Dr. Katrinka Blazing 2022-->good results   Hypogonadism in male    OAB (overactive bladder)    urol   Stenosing tenosynovitis    R thumb and L middle finger-->good response to inj by ortho 07/2020    Not on File   Review of Systems:  No headache, visual changes, nausea, vomiting, diarrhea, constipation, dizziness, abdominal pain, skin rash, fevers, chills, night sweats, weight loss, swollen lymph nodes, body aches, joint swelling, chest pain, shortness of breath, mood changes. POSITIVE muscle aches  Objective  Blood pressure 124/84, pulse 72, height 5\' 10"  (1.778 m), weight 202 lb (91.6 kg), SpO2 95 %.   General: No apparent distress alert and oriented x3 mood  and affect normal, dressed appropriately.  HEENT: Pupils equal, extraocular movements intact  Respiratory: Patient's speak in full sentences and does not appear short of breath  Cardiovascular: No lower extremity edema, non tender, no erythema  Right knee does have some swelling noted.  Effusion noted.  Significant crepitus noted. Tender to palpation over the medial joint line.  No instability with valgus and varus force of both knees though.  Low back exam does have some loss of lordosis.  Some tenderness to palpation mostly over the right sacroiliac joint.  Positive .  Negative straight leg test.  Osteopathic findings  T9 extended rotated and side bent left L2 flexed rotated and side bent right Sacrum right on right       Assessment and Plan:  Degenerative arthritis of knee, bilateral Significant arthritic changes bilaterally.  Discussed again about the possibility of other injections.  The patient does have the meloxicam and iron encouraged him to only take it intermittently.  Patient only take it 3 days at a time.  Who knows about his kidney dysfunction and is following up with his nephrologist.  Patient will avoid other anti-inflammatories otherwise.  Refilled once weekly vitamin D for the next 12 weeks as well.  Follow-up with me again in 2 to 3 months  SI (sacroiliac) joint dysfunction Chronic problem with exacerbation.  Discussed with patient about icing regimen and home exercises, which activities to do which ones to avoid Increase activity  slowly otherwise.  Follow-up with me again in 6  8 weeks.  Refill medications as we discussed above. Patient is going to monitor for the any type of kidney dysfunction.  We once again discussed the possibility of this hurting his kidneys.  Patient feels it is something that is significantly.   Nonallopathic problems  Decision today to treat with OMT was based on Physical Exam  After verbal consent patient was treated with HVLA, ME,  FPR techniques in  thoracic, lumbar, and sacral  areas  Patient tolerated the procedure well with improvement in symptoms  Patient given exercises, stretches and lifestyle modifications  See medications in patient instructions if given  Patient will follow up in 4-8 weeks     The above documentation has been reviewed and is accurate and complete [name], DO        Note: This dictation was prepared with Dragon dictation along with smaller phrase technology. Any transcriptional errors that result from this process are unintentional.

## 2021-11-14 ENCOUNTER — Ambulatory Visit (INDEPENDENT_AMBULATORY_CARE_PROVIDER_SITE_OTHER): Payer: 59 | Admitting: Family Medicine

## 2021-11-14 ENCOUNTER — Encounter: Payer: Self-pay | Admitting: Family Medicine

## 2021-11-14 VITALS — BP 124/84 | HR 72 | Ht 70.0 in | Wt 202.0 lb

## 2021-11-14 DIAGNOSIS — M533 Sacrococcygeal disorders, not elsewhere classified: Secondary | ICD-10-CM | POA: Diagnosis not present

## 2021-11-14 DIAGNOSIS — M9904 Segmental and somatic dysfunction of sacral region: Secondary | ICD-10-CM | POA: Diagnosis not present

## 2021-11-14 DIAGNOSIS — M9903 Segmental and somatic dysfunction of lumbar region: Secondary | ICD-10-CM

## 2021-11-14 DIAGNOSIS — M9902 Segmental and somatic dysfunction of thoracic region: Secondary | ICD-10-CM

## 2021-11-14 DIAGNOSIS — M170 Bilateral primary osteoarthritis of knee: Secondary | ICD-10-CM

## 2021-11-14 MED ORDER — VITAMIN D (ERGOCALCIFEROL) 1.25 MG (50000 UNIT) PO CAPS: 50000.0000 [IU] | ORAL_CAPSULE | ORAL | 0 refills | Status: DC

## 2021-11-14 MED ORDER — MELOXICAM 15 MG PO TABS: 15.0000 mg | ORAL_TABLET | Freq: Every day | ORAL | 0 refills | Status: AC

## 2021-11-14 NOTE — Patient Instructions (Signed)
Vit D prescription Meloxicam 3-5 days at a time when you flare See you again in 2 months

## 2021-11-14 NOTE — Assessment & Plan Note (Signed)
Chronic problem with exacerbation.  Discussed with patient about icing regimen and home exercises, which activities to do which ones to avoid Increase activity slowly otherwise.  Follow-up with me again in 6  8 weeks.  Refill medications as we discussed above. Patient is going to monitor for the any type of kidney dysfunction.  We once again discussed the possibility of this hurting his kidneys.  Patient feels it is something that is significantly.

## 2021-11-14 NOTE — Assessment & Plan Note (Signed)
Significant arthritic changes bilaterally.  Discussed again about the possibility of other injections.  The patient does have the meloxicam and iron encouraged him to only take it intermittently.  Patient only take it 3 days at a time.  Who knows about his kidney dysfunction and is following up with his nephrologist.  Patient will avoid other anti-inflammatories otherwise.  Refilled once weekly vitamin D for the next 12 weeks as well.  Follow-up with me again in 2 to 3 months

## 2021-12-20 NOTE — Progress Notes (Signed)
[name]      [address] [place] [address] Phone: [phone] Subjective:   [name], am serving as a scribe for Dr. Antoine Primas.  205lb I'm seeing this patient by the request  of:  Pcp, No  CC: Back and neck pain, knee pain follow-up  [id number]  Joshua [name] is a 51 y.o. male coming in with complaint of back and neck pain. OMT 11/14/2021. Patient states that his R elbow is bothering him over olecranon. history of surgery for bursae removal.   Standing and movement increase his pain in lumbar spine. Pain was radiating down R leg 2 years ago but epidural helped symptoms.   B knee pain especially R knee. Pain over lateral aspect. Feels as if it needs to be drained.  Known arthritic changes.  Medications patient has been prescribed: Meloxicam, Vit D  Taking:         Reviewed prior external information including notes and imaging from previsou exam, outside providers and external EMR if available.   As well as notes that were available from care everywhere and other healthcare systems.  Past medical history, social, surgical and family history all reviewed in electronic medical record.  No pertanent information unless stated regarding to the chief complaint.   Past Medical History:  Diagnosis Date   Alcoholism (HCC)    Anxiety and depression    BPH with obstruction/lower urinary tract symptoms    Chronic back pain    sports med   Chronic pain of right knee    Chronic pain of right knee    multiple surgeries.  Baker's cyst aspirated/injected by Dr. Katrinka Blazing 2022-->good results   Hypogonadism in male    OAB (overactive bladder)    urol   Stenosing tenosynovitis    R thumb and L middle finger-->good response to inj by ortho 07/2020    Not on File   Review of Systems:  No headache, visual changes, nausea, vomiting, diarrhea, constipation, dizziness, abdominal pain, skin rash, fevers, chills, night sweats, weight loss,  swollen lymph nodes, body aches, joint swelling, chest pain, shortness of breath, mood changes. POSITIVE muscle aches  Objective  Blood pressure 130/84, pulse 81, height 5\' 10"  (1.778 m), SpO2 98 %.   General: No apparent distress alert and oriented x3 mood and affect normal, dressed appropriately.  HEENT: Pupils equal, extraocular movements intact  Respiratory: Patient's speak in full sentences and does not appear short of breath  Cardiovascular: No lower extremity edema, non tender, no erythema  Gait MSK:  Back does have some loss of lordosis.  Tightness noted over the sacroiliac joint bilaterally.  Significant tightness of the hip flexor also noted bilaterally.  Tenderness to palpation diffusely from the thoracolumbar juncture to the sacroiliac joints bilaterally.  Knee exam does have fullness noted right greater than left.  The patient does have fullness noted in the popliteal area right greater than left as well.  The patient does have limited range of motion right greater than left but still has instability of the knees bilaterally.  Osteopathic findings  C5 flexed rotated and side bent right T3 extended rotated and side bent right inhaled rib T9 extended rotated and side bent left L2 flexed rotated and side bent right L5 flexed rotated and side bent right Sacrum left on left  Procedure: Real-time Ultrasound Guided Injection of right knee and aspiration of Baker's cyst Device: GE Logiq Q7 Ultrasound guided injection is preferred based studies that show  increased duration, increased effect, greater accuracy, decreased procedural pain, increased response rate, and decreased cost with ultrasound guided versus blind injection.  Verbal informed consent obtained.  Time-out conducted.  Noted no overlying erythema, induration, or other signs of local infection.  Skin prepped in a sterile fashion.  Local anesthesia: Topical Ethyl chloride.  With sterile technique and under real time  ultrasound guidance: With a 22-gauge 2 inch needle patient was injected with 2 cc of 0.5% Marcaine and aspirated 8 cc of straw light-colored fluid then injected 1 cc of Kenalog 40 mg/dL. This was from a posterior approach.  Completed without difficulty  Pain immediately resolved suggesting accurate placement of the medication.  Advised to call if fevers/chills, erythema, induration, drainage, or persistent bleeding.  Impression: Technically successful ultrasound guided injection.       Assessment and Plan:  Degenerative arthritis of knee, bilateral Patient had aspiration done today.  Does have significant arthritic changes noted though.  Discussed with patient about icing regimen and home exercises otherwise.  Patient may need surgical intervention  Patient is young age.  Patient wants to hold off as long as possible at this time.  Increase activity slowly otherwise.  Follow-up with me again in 6 to 8 weeks otherwise.  SI (sacroiliac) joint dysfunction Chronic problem with exacerbation.  Continues to have pain that is out of proportion.  Attempted laboratory work-up previously that did not have any significant findings at the moment.  Discussed posture and ergonomics.  Discussed which activities to do and which ones to avoid.  Patient continues to stay very active.  Follow-up again in 6 to 8 weeks.    Nonallopathic problems  Decision today to treat with OMT was based on Physical Exam  After verbal consent patient was treated with HVLA, ME, FPR techniques in cervical, rib, thoracic, lumbar, and sacral  areas  Patient tolerated the procedure well with improvement in symptoms  Patient given exercises, stretches and lifestyle modifications  See medications in patient instructions if given  Patient will follow up in 4-8 weeks    The above documentation has been reviewed and is accurate and complete [name], DO          Note: This dictation was prepared with Dragon  dictation along with smaller phrase technology. Any transcriptional errors that result from this process are unintentional.

## 2021-12-31 ENCOUNTER — Encounter: Payer: Self-pay | Admitting: Family Medicine

## 2021-12-31 ENCOUNTER — Ambulatory Visit (INDEPENDENT_AMBULATORY_CARE_PROVIDER_SITE_OTHER): Payer: 59 | Admitting: Family Medicine

## 2021-12-31 ENCOUNTER — Ambulatory Visit: Payer: Self-pay

## 2021-12-31 VITALS — BP 130/84 | HR 81 | Ht 70.0 in

## 2021-12-31 DIAGNOSIS — M9904 Segmental and somatic dysfunction of sacral region: Secondary | ICD-10-CM

## 2021-12-31 DIAGNOSIS — M9908 Segmental and somatic dysfunction of rib cage: Secondary | ICD-10-CM

## 2021-12-31 DIAGNOSIS — M9901 Segmental and somatic dysfunction of cervical region: Secondary | ICD-10-CM

## 2021-12-31 DIAGNOSIS — M25561 Pain in right knee: Secondary | ICD-10-CM

## 2021-12-31 DIAGNOSIS — M170 Bilateral primary osteoarthritis of knee: Secondary | ICD-10-CM

## 2021-12-31 DIAGNOSIS — M533 Sacrococcygeal disorders, not elsewhere classified: Secondary | ICD-10-CM

## 2021-12-31 DIAGNOSIS — M9903 Segmental and somatic dysfunction of lumbar region: Secondary | ICD-10-CM

## 2021-12-31 DIAGNOSIS — M9902 Segmental and somatic dysfunction of thoracic region: Secondary | ICD-10-CM

## 2021-12-31 NOTE — Assessment & Plan Note (Signed)
Chronic problem with exacerbation.  Continues to have pain that is out of proportion.  Attempted laboratory work-up previously that did not have any significant findings at the moment.  Discussed posture and ergonomics.  Discussed which activities to do and which ones to avoid.  Patient continues to stay very active.  Follow-up again in 6 to 8 weeks.

## 2021-12-31 NOTE — Assessment & Plan Note (Signed)
Patient had aspiration done today.  Does have significant arthritic changes noted though.  Discussed with patient about icing regimen and home exercises otherwise.  Patient may need surgical intervention  Patient is young age.  Patient wants to hold off as long as possible at this time.  Increase activity slowly otherwise.  Follow-up with me again in 6 to 8 weeks otherwise.

## 2021-12-31 NOTE — Patient Instructions (Signed)
Drained cyst today See Dr. Jean Rosenthal for Shockwave therapy See me again in 5-6 weeks

## 2022-01-02 ENCOUNTER — Ambulatory Visit: Payer: Self-pay | Admitting: Sports Medicine

## 2022-01-02 DIAGNOSIS — M25521 Pain in right elbow: Secondary | ICD-10-CM

## 2022-01-02 NOTE — Progress Notes (Signed)
   Richardean Sale [facility] [address] [place] [address] Phone: [phone]   Extracorporeal Shockwave Therapy Note    Patient is being treated today with ECSWT. Informed consent was obtained and patient tolerated procedure well.   Therapy performed by Richardean Sale  Condition treated: Olecranon pain with features of triceps tendinitis, medial and lateral epicondylitis Treatment preset used: Medial epicondylitis Energy used: 90 mJ Frequency used: 10 hz Number of pulses: 2000 Treatment #1 of #3  Electronically signed by:  [facility] 11:20 AM 01/02/22

## 2022-01-09 ENCOUNTER — Ambulatory Visit (INDEPENDENT_AMBULATORY_CARE_PROVIDER_SITE_OTHER): Payer: Self-pay | Admitting: Sports Medicine

## 2022-01-09 DIAGNOSIS — M25521 Pain in right elbow: Secondary | ICD-10-CM

## 2022-01-09 NOTE — Progress Notes (Signed)
   Richardean Sale [facility] [address] [place] [address] Phone: [phone]   Extracorporeal Shockwave Therapy Note    Patient is being treated today with ECSWT. Informed consent was obtained and patient tolerated procedure well.   Therapy performed by Richardean Sale  Condition treated: Olecranon pain with features of triceps tendinitis, medial and lateral epicondylitis Treatment preset used: Acute medial epicondylitis Energy used: 90 mJ Frequency used: 10 hz Number of pulses: 2000 Treatment #2 of #3  Electronically signed by:  Marcelline [facility] 1:22 PM 01/09/22

## 2022-01-09 NOTE — Progress Notes (Signed)
[name] [address] [place] [address] Phone: [phone] Subjective:   Joshua [name], am serving as a scribe for Dr. Antoine Primas.  I'm seeing this patient by the request  of:  Pcp, No  CC: knee and back pain   [id number]  Joshua [name] is a 51 y.o. male coming in with complaint of back and neck pain. OMT 12/31/2021. Also f/u for R knee pain. Patient states that his knees have been bothering him. L>R. Would like steroid injection today.    Saw another provider for elbow pain on 7/20 for shockwave therapy   Medications patient has been prescribed: Vit D, Meloxicam  Taking:         Reviewed prior external information including notes and imaging from previsou exam, outside providers and external EMR if available.   As well as notes that were available from care everywhere and other healthcare systems.  Past medical history, social, surgical and family history all reviewed in electronic medical record.  No pertanent information unless stated regarding to the chief complaint.   Past Medical History:  Diagnosis Date   Alcoholism (HCC)    Anxiety and depression    BPH with obstruction/lower urinary tract symptoms    Chronic back pain    sports med   Chronic pain of right knee    Chronic pain of right knee    multiple surgeries.  Baker's cyst aspirated/injected by Dr. Katrinka Blazing 2022-->good results   Hypogonadism in male    OAB (overactive bladder)    urol   Stenosing tenosynovitis    R thumb and L middle finger-->good response to inj by ortho 07/2020    Not on File   Review of Systems:  No headache, visual changes, nausea, vomiting, diarrhea, constipation, dizziness, abdominal pain, skin rash, fevers, chills, night sweats, weight loss, swollen lymph nodes, body aches, joint swelling, chest pain, shortness of breath, mood changes. POSITIVE muscle aches  Objective  Blood pressure 126/82, pulse 78, height 5\' 10"  (1.778 m), weight  205 lb (93 kg), SpO2 97 %.   General: No apparent distress alert and oriented x3 mood and affect normal, dressed appropriately.  HEENT: Pupils equal, extraocular movements intact  Respiratory: Patient's speak in full sentences and does not appear short of breath  Cardiovascular: No lower extremity edema, non tender, no erythema  Gait mild antalgic  MSK:  Back back exam does have some loss of lordosis.  Continues to have tenderness noted over the sacroiliac joints bilaterally.  Very mild overall from patient's previous exams.  Right knee does have some instability noted as well.  Does have some crepitus noted with range of motion.  After informed written and verbal consent, patient was seated on exam table. Right knee was prepped with alcohol swab and utilizing anterolateral approach, patient's right knee space was injected with 4:1  marcaine 0.5%: Kenalog 40mg /dL. Patient tolerated the procedure well without immediate complications.  Osteopathic findings  C2 flexed rotated and side bent right C6 flexed rotated and side bent left T3 extended rotated and side bent right inhaled rib T8 extended rotated and side bent left L3 flexed rotated and side bent right Sacrum right on right     Assessment and Plan:  Degenerative arthritis of knee, bilateral Repeat injection given today secondary to pain.  Hopefully this will make some improvement.  Patient will need a knee replacement at some point in the relatively near future.  Discussed icing regimen and home exercises otherwise.  Follow-up again in 6 to 8 weeks.  SI (sacroiliac) joint dysfunction Chronic problem but relatively stable.  Patient mood continues to be relatively active.  No significant radicular symptoms just some mild limited range of motion in all planes today.    Nonallopathic problems  Decision today to treat with OMT was based on Physical Exam  After verbal consent patient was treated with HVLA, ME, FPR techniques in  cervical, rib, thoracic, lumbar, and sacral  areas  Patient tolerated the procedure well with improvement in symptoms  Patient given exercises, stretches and lifestyle modifications  See medications in patient instructions if given  Patient will follow up in 4-8 weeks     The above documentation has been reviewed and is accurate and complete [name], DO         Note: This dictation was prepared with Dragon dictation along with smaller phrase technology. Any transcriptional errors that result from this process are unintentional.

## 2022-01-14 ENCOUNTER — Ambulatory Visit: Payer: 59 | Admitting: Family Medicine

## 2022-01-14 VITALS — BP 126/82 | HR 78 | Ht 70.0 in | Wt 205.0 lb

## 2022-01-14 DIAGNOSIS — M9902 Segmental and somatic dysfunction of thoracic region: Secondary | ICD-10-CM

## 2022-01-14 DIAGNOSIS — M9901 Segmental and somatic dysfunction of cervical region: Secondary | ICD-10-CM | POA: Diagnosis not present

## 2022-01-14 DIAGNOSIS — M9904 Segmental and somatic dysfunction of sacral region: Secondary | ICD-10-CM | POA: Diagnosis not present

## 2022-01-14 DIAGNOSIS — M9908 Segmental and somatic dysfunction of rib cage: Secondary | ICD-10-CM | POA: Diagnosis not present

## 2022-01-14 DIAGNOSIS — M170 Bilateral primary osteoarthritis of knee: Secondary | ICD-10-CM

## 2022-01-14 DIAGNOSIS — M533 Sacrococcygeal disorders, not elsewhere classified: Secondary | ICD-10-CM | POA: Diagnosis not present

## 2022-01-14 DIAGNOSIS — M9903 Segmental and somatic dysfunction of lumbar region: Secondary | ICD-10-CM | POA: Diagnosis not present

## 2022-01-14 NOTE — Assessment & Plan Note (Signed)
Chronic problem but relatively stable.  Patient mood continues to be relatively active.  No significant radicular symptoms just some mild limited range of motion in all planes today.

## 2022-01-14 NOTE — Assessment & Plan Note (Signed)
Repeat injection given today secondary to pain.  Hopefully this will make some improvement.  Patient will need a knee replacement at some point in the relatively near future.  Discussed icing regimen and home exercises otherwise.  Follow-up again in 6 to 8 weeks.

## 2022-01-14 NOTE — Patient Instructions (Signed)
Injected R knee only today

## 2022-01-15 ENCOUNTER — Ambulatory Visit: Payer: Self-pay | Admitting: Sports Medicine

## 2022-01-15 DIAGNOSIS — M25521 Pain in right elbow: Secondary | ICD-10-CM

## 2022-01-15 NOTE — Progress Notes (Signed)
   Richardean Sale [facility] [address] [place] [address] Phone: [phone]   Extracorporeal Shockwave Therapy Note    Patient is being treated today with ECSWT. Informed consent was obtained and patient tolerated procedure well.   Therapy performed by Richardean Sale  Condition treated: Olecranon pain with features of triceps tendinitis, medial and lateral epicondylitis Treatment preset used: Acute medial epicondylitis Energy used: 90 mJ Frequency used: 10 hz Number of pulses: 2000 Treatment #3 of #3  Treatment completed at this visit.  Overall patient has felt improvement with increased range of motion and decreased pain.  Electronically signed by:  Marcelline [facility]ates [facility] 2:38 PM 01/15/22

## 2022-01-23 NOTE — Progress Notes (Addendum)
[name] [address] [place] [address] Phone: [phone] Subjective:   Joshua [name], am serving as a scribe for Dr. Antoine Primas.  I'm seeing this patient by the request  of:  Pcp, No  CC: Back and neck pain follow-up  [id number]  Joshua [name] is a 51 y.o. male coming in with complaint of back and neck pain. OMT on 01/14/2022. Patient states same per usual. No new issues  Medications patient has been prescribed: Vit D, Mobic  Taking:         Reviewed prior external information including notes and imaging from previsou exam, outside providers and external EMR if available.   As well as notes that were available from care everywhere and other healthcare systems.  Past medical history, social, surgical and family history all reviewed in electronic medical record.  No pertanent information unless stated regarding to the chief complaint.   Past Medical History:  Diagnosis Date   Alcoholism (HCC)    Anxiety and depression    BPH with obstruction/lower urinary tract symptoms    Chronic back pain    sports med   Chronic pain of right knee    Chronic pain of right knee    multiple surgeries.  Baker's cyst aspirated/injected by Dr. Katrinka Blazing 2022-->good results   Hypogonadism in male    OAB (overactive bladder)    urol   Stenosing tenosynovitis    R thumb and L middle finger-->good response to inj by ortho 07/2020    Not on File   Review of Systems:  No headache, visual changes, nausea, vomiting, diarrhea, constipation, dizziness, abdominal pain, skin rash, fevers, chills, night sweats, weight loss, swollen lymph nodes, body aches, joint swelling, chest pain, shortness of breath, mood changes. POSITIVE muscle aches  Objective  Blood pressure 116/78, pulse 70, height 5\' 10"  (1.778 m), weight 204 lb (92.5 kg), SpO2 97 %.   General: No apparent distress alert and oriented x3 mood and affect normal, dressed appropriately.  HEENT:  Pupils equal, extraocular movements intact  Respiratory: Patient's speak in full sentences and does not appear short of breath  Cardiovascular: No lower extremity edema, non tender, no erythema  Gait antalgic gait MSK:  Back does have loss of lordosis.  Some tightness noted in the paraspinal musculature.  Patient does lack the last 10 degrees of extension. Patient's right knee effusion noted of the patellofemoral joint.  Instability noted with valgus and varus force.  Tender to palpation lacking last 5 degrees of flexion  After informed written and verbal consent, patient was seated on exam table. Right knee was prepped with alcohol swab and utilizing anterolateral approach, patient's right knee space was injected with 4:1  marcaine 0.5%: tordol 30mg /mL. Patient tolerated the procedure well without immediate complications.  Osteopathic findings  C2 flexed rotated and side bent right C6 flexed rotated and side bent left T3 extended rotated and side bent right inhaled rib T9 extended rotated and side bent left L2 flexed rotated and side bent right Sacrum right on right       Assessment and Plan:  SI (sacroiliac) joint dysfunction Chronic problem, discussed HEP, Discussed which activities to do and which ones to avoid  Patient is to continue to work on the range of motion.  Discussed which activities to do which ones to avoid.  Follow-up again in 6 to 8 weeks  Degenerative arthritis of knee, bilateral Right-sided and Toradol injection given.  We will see how patient responds.  Continues to have difficulty overall.  Follow-up again in 6 to 8 weeks.    Nonallopathic problems  Decision today to treat with OMT was based on Physical Exam  After verbal consent patient was treated with HVLA, ME, FPR techniques in cervical, rib, thoracic, lumbar, and sacral  areas  Patient tolerated the procedure well with improvement in symptoms  Patient given exercises, stretches and lifestyle  modifications  See medications in patient instructions if given  Patient will follow up in 4-8 weeks     The above documentation has been reviewed and is accurate and complete Joshua Saa, DO         Note: This dictation was prepared with Dragon dictation along with smaller phrase technology. Any transcriptional errors that result from this process are unintentional.

## 2022-01-28 ENCOUNTER — Ambulatory Visit: Payer: 59 | Admitting: Family Medicine

## 2022-01-28 VITALS — BP 116/78 | HR 70 | Ht 70.0 in | Wt 204.0 lb

## 2022-01-28 DIAGNOSIS — M1711 Unilateral primary osteoarthritis, right knee: Secondary | ICD-10-CM

## 2022-01-28 DIAGNOSIS — M533 Sacrococcygeal disorders, not elsewhere classified: Secondary | ICD-10-CM | POA: Diagnosis not present

## 2022-01-28 DIAGNOSIS — M9902 Segmental and somatic dysfunction of thoracic region: Secondary | ICD-10-CM

## 2022-01-28 DIAGNOSIS — M9908 Segmental and somatic dysfunction of rib cage: Secondary | ICD-10-CM

## 2022-01-28 DIAGNOSIS — M9904 Segmental and somatic dysfunction of sacral region: Secondary | ICD-10-CM

## 2022-01-28 DIAGNOSIS — M9901 Segmental and somatic dysfunction of cervical region: Secondary | ICD-10-CM

## 2022-01-28 DIAGNOSIS — M9903 Segmental and somatic dysfunction of lumbar region: Secondary | ICD-10-CM

## 2022-01-28 DIAGNOSIS — M170 Bilateral primary osteoarthritis of knee: Secondary | ICD-10-CM

## 2022-01-28 MED ORDER — KETOROLAC TROMETHAMINE 30 MG/ML IJ SOLN
30.0000 mg | Freq: Once | INTRAMUSCULAR | Status: AC
  Administered 2022-01-28: 30 mg via INTRA_ARTICULAR

## 2022-01-28 NOTE — Assessment & Plan Note (Signed)
Right-sided and Toradol injection given.  We will see how patient responds.  Continues to have difficulty overall.  Follow-up again in 6 to 8 weeks.

## 2022-01-28 NOTE — Assessment & Plan Note (Signed)
Chronic problem, discussed HEP, Discussed which activities to do and which ones to avoid  Patient is to continue to work on the range of motion.  Discussed which activities to do which ones to avoid.  Follow-up again in 6 to 8 weeks

## 2022-03-03 NOTE — Progress Notes (Unsigned)
[name] [address] [place] [address] Phone: [phone] Subjective:   [name], am serving as a scribe for Dr. Antoine Primas.  I'm seeing this patient by the request  of:  Pcp, No  CC: back and neck pain f/u  [id number]  Joshua [name] is a 51 y.o. male coming in with complaint of back and neck pain. OMT on 01/28/2022. Patient states doing well. He's certain right knee needs to be replaced. Will do after trip to [place]. Would like injection today. Here for manipulation. No other complaints.          Reviewed prior external information including notes and imaging from previsou exam, outside providers and external EMR if available.   As well as notes that were available from care everywhere and other healthcare systems.  Past medical history, social, surgical and family history all reviewed in electronic medical record.  No pertanent information unless stated regarding to the chief complaint.   Past Medical History:  Diagnosis Date   Alcoholism (HCC)    Anxiety and depression    BPH with obstruction/lower urinary tract symptoms    Chronic back pain    sports med   Chronic pain of right knee    Chronic pain of right knee    multiple surgeries.  Baker's cyst aspirated/injected by Dr. Katrinka Blazing 2022-->good results   Hypogonadism in male    OAB (overactive bladder)    urol   Stenosing tenosynovitis    R thumb and L middle finger-->good response to inj by ortho 07/2020    Not on File   Review of Systems:  No headache, visual changes, nausea, vomiting, diarrhea, constipation, dizziness, abdominal pain, skin rash, fevers, chills, night sweats, weight loss, swollen lymph nodes, body aches, joint swelling, chest pain, shortness of breath, mood changes. POSITIVE muscle aches  Objective  Blood pressure 124/74, pulse 69, height 5\' 10"  (1.778 m), weight 208 lb (94.3 kg), SpO2 97 %.   General: No apparent distress alert and oriented x3  mood and affect normal, dressed appropriately.  HEENT: Pupils equal, extraocular movements intact  Respiratory: Patient's speak in full sentences and does not appear short of breath  Cardiovascular: No lower extremity edema, non tender, no erythema  Gait antalgic favoring the right knee.  Instability noted with valgus and varus force. MSK:  Back neck exam does have some loss of lordosis.  Some tenderness to palpation in the paraspinal musculature.  Tightness with FABER test bilaterally.  Positive straight leg test on the right side which is new.  Weakness of the dorsiflexion of the foot with 4 out of 5 strength compared to contralateral side  Osteopathic findings  C2 flexed rotated and side bent right C6 flexed rotated and side bent right  T3 extended rotated and side bent right inhaled rib T8 extended rotated and side bent left L2 flexed rotated and side bent right L5 flexed rotated and side bent left Sacrum right on right  After informed written and verbal consent, patient was seated on exam table. Right knee was prepped with alcohol swab and utilizing anterolateral approach, patient's right knee space was injected with 4:1  marcaine 0.5%: Kenalog 40mg /dL. Patient tolerated the procedure well without immediate complications.     Assessment and Plan:  Degenerative arthritis of knee, bilateral Discussed HEP  Patient still wants to consider the possibility of surgery in the near future.  We discussed the only thing else that is left is injections as needed.  Prednisone daily for 5 days.    Nonallopathic problems  Decision today to treat with OMT was based on Physical Exam  After verbal consent patient was treated with HVLA, ME, FPR techniques in cervical, rib, thoracic, lumbar, and sacral  areas  Patient tolerated the procedure well with improvement in symptoms  Patient given exercises, stretches and lifestyle modifications  See medications in patient instructions if  given  Patient will follow up in 4-8 weeks    The above documentation has been reviewed and is accurate and complete [name], DO          Note: This dictation was prepared with Dragon dictation along with smaller phrase technology. Any transcriptional errors that result from this process are unintentional.

## 2022-03-04 ENCOUNTER — Ambulatory Visit: Payer: 59 | Admitting: Family Medicine

## 2022-03-04 VITALS — BP 124/74 | HR 69 | Ht 70.0 in | Wt 208.0 lb

## 2022-03-04 DIAGNOSIS — M9908 Segmental and somatic dysfunction of rib cage: Secondary | ICD-10-CM

## 2022-03-04 DIAGNOSIS — M545 Low back pain, unspecified: Secondary | ICD-10-CM | POA: Diagnosis not present

## 2022-03-04 DIAGNOSIS — M9903 Segmental and somatic dysfunction of lumbar region: Secondary | ICD-10-CM | POA: Diagnosis not present

## 2022-03-04 DIAGNOSIS — M9901 Segmental and somatic dysfunction of cervical region: Secondary | ICD-10-CM

## 2022-03-04 DIAGNOSIS — M170 Bilateral primary osteoarthritis of knee: Secondary | ICD-10-CM

## 2022-03-04 DIAGNOSIS — M5416 Radiculopathy, lumbar region: Secondary | ICD-10-CM

## 2022-03-04 DIAGNOSIS — M9902 Segmental and somatic dysfunction of thoracic region: Secondary | ICD-10-CM

## 2022-03-04 DIAGNOSIS — M9904 Segmental and somatic dysfunction of sacral region: Secondary | ICD-10-CM | POA: Diagnosis not present

## 2022-03-04 MED ORDER — PREDNISONE 50 MG PO TABS: 50.0000 mg | ORAL_TABLET | Freq: Every day | ORAL | 0 refills | Status: DC

## 2022-03-04 NOTE — Assessment & Plan Note (Signed)
Patient does have some lumbar radiculopathy.  Has had multiple injections that seem to help short-term previously.  Patient is having radicular symptoms again and will be traveling.  Starting to affect daily activities as well as working up at night.  Do feel that a new MRI is necessary at this time.  Depending on findings he could be a potential candidate for epidurals, nerve root injections and radiofrequency ablation.  Follow-up with me after imaging to discuss further

## 2022-03-04 NOTE — Assessment & Plan Note (Signed)
Discussed HEP  Patient still wants to consider the possibility of surgery in the near future.  We discussed the only thing else that is left is injections as needed.  Prednisone daily for 5 days.

## 2022-03-04 NOTE — Patient Instructions (Addendum)
Injection in knee today Happy hunting

## 2022-03-09 ENCOUNTER — Other Ambulatory Visit: Payer: Self-pay | Admitting: Family Medicine

## 2022-03-15 ENCOUNTER — Ambulatory Visit
Admission: RE | Admit: 2022-03-15 | Discharge: 2022-03-15 | Disposition: A | Discharge status: Home / Self Care | Payer: 59 | Source: Ambulatory Visit | Attending: Family Medicine | Admitting: Family Medicine

## 2022-03-15 DIAGNOSIS — M545 Low back pain, unspecified: Secondary | ICD-10-CM

## 2022-03-17 ENCOUNTER — Other Ambulatory Visit: Payer: Self-pay

## 2022-03-17 ENCOUNTER — Encounter: Payer: Self-pay | Admitting: Family Medicine

## 2022-03-17 DIAGNOSIS — G8929 Other chronic pain: Secondary | ICD-10-CM

## 2022-03-31 NOTE — Progress Notes (Signed)
[name] [address] [place] [address] Phone: [phone] Subjective:   [name], am serving as a scribe for Dr. Antoine Primas.  I'm seeing this patient by the request  of:  Pcp, No  CC: Right knee follow-up, low back pain  OZH:[id number]  Joshua [name] is a 51 y.o. male coming in with complaint of back and neck pain. OMT on 03/04/2022. Patient states that he had to take 2 prednisone during his hunting trip. Otherwise back is sore from working out.  Nothing that stops him.  Patient did go on a new splint.  He was able to do it and did take only the 2 days of the prednisone.  Patient denies any other new symptoms at the moment.  Just some worsening of the previous symptoms.  Medications patient has been prescribed: Vit D and prednisone  Taking:         Reviewed prior external information including notes and imaging from previsou exam, outside providers and external EMR if available.   As well as notes that were available from care everywhere and other healthcare systems.  Past medical history, social, surgical and family history all reviewed in electronic medical record.  No pertanent information unless stated regarding to the chief complaint.   Past Medical History:  Diagnosis Date   Alcoholism (HCC)    Anxiety and depression    BPH with obstruction/lower urinary tract symptoms    Chronic back pain    sports med   Chronic pain of right knee    Chronic pain of right knee    multiple surgeries.  Baker's cyst aspirated/injected by Dr. Katrinka Blazing 2022-->good results   Hypogonadism in male    OAB (overactive bladder)    urol   Stenosing tenosynovitis    R thumb and L middle finger-->good response to inj by ortho 07/2020    Not on File   Review of Systems:  No headache, visual changes, nausea, vomiting, diarrhea, constipation, dizziness, abdominal pain, skin rash, fevers, chills, night sweats, weight loss, swollen lymph nodes, body  aches, joint swelling, chest pain, shortness of breath, mood changes. POSITIVE muscle aches  Objective  Blood pressure 128/78, pulse 73, height 5\' 10"  (1.778 m), weight 206 lb (93.4 kg), SpO2 96 %.   General: No apparent distress alert and oriented x3 mood and affect normal, dressed appropriately.  HEENT: Pupils equal, extraocular movements intact  Respiratory: Patient's speak in full sentences and does not appear short of breath  Cardiovascular: No lower extremity edema, non tender, no erythema  Gait MSK:  Back does have some loss of lordosis.  Some tightness in the paraspinal musculature bilaterally.  Seems to be right greater than left.  Some limited range of motion of the hips bilaterally.  Right knee does have the arthritic changes noted with a trace effusion noted.  Osteopathic findings  C2 flexed rotated and side bent right C6 flexed rotated and side bent left T3 extended rotated and side bent right inhaled rib T9 extended rotated and side bent left L2 flexed rotated and side bent right Sacrum right on right       Assessment and Plan:  SI (sacroiliac) joint dysfunction Stable but pain, discussed HEP, will be having knee replacement  Will likely be better after not having to compensate for the knee.  We will follow-up with me again in 6 weeks after surgery.  Primary osteoarthritis of right knee Patient is being scheduled for a knee replacement in the  near future.  They are still going back and forth for unilateral compartment or full knee.  When reviewing patient's previous MRI in great detail patient does mostly have the medial compartment but there is some narrowing for sure of the patellofemoral and lateral as well.    Nonallopathic problems  Decision today to treat with OMT was based on Physical Exam  After verbal consent patient was treated with HVLA, ME, FPR techniques in cervical, rib, thoracic, lumbar, and sacral  areas  Patient tolerated the procedure well  with improvement in symptoms  Patient given exercises, stretches and lifestyle modifications  See medications in patient instructions if given  Patient will follow up in 4-8 weeks     The above documentation has been reviewed and is accurate and complete Lyndal Pulley, DO         Note: This dictation was prepared with Dragon dictation along with smaller phrase technology. Any transcriptional errors that result from this process are unintentional.

## 2022-04-03 ENCOUNTER — Ambulatory Visit: Payer: 59 | Admitting: Family Medicine

## 2022-04-03 ENCOUNTER — Encounter: Payer: Self-pay | Admitting: Orthopaedic Surgery

## 2022-04-03 ENCOUNTER — Ambulatory Visit (INDEPENDENT_AMBULATORY_CARE_PROVIDER_SITE_OTHER): Payer: 59

## 2022-04-03 ENCOUNTER — Ambulatory Visit (INDEPENDENT_AMBULATORY_CARE_PROVIDER_SITE_OTHER): Payer: 59 | Admitting: Orthopaedic Surgery

## 2022-04-03 VITALS — BP 128/78 | HR 73 | Ht 70.0 in | Wt 206.0 lb

## 2022-04-03 VITALS — Ht 70.0 in | Wt 200.0 lb

## 2022-04-03 DIAGNOSIS — M533 Sacrococcygeal disorders, not elsewhere classified: Secondary | ICD-10-CM | POA: Diagnosis not present

## 2022-04-03 DIAGNOSIS — M9903 Segmental and somatic dysfunction of lumbar region: Secondary | ICD-10-CM

## 2022-04-03 DIAGNOSIS — M1711 Unilateral primary osteoarthritis, right knee: Secondary | ICD-10-CM

## 2022-04-03 DIAGNOSIS — M9908 Segmental and somatic dysfunction of rib cage: Secondary | ICD-10-CM

## 2022-04-03 DIAGNOSIS — M9902 Segmental and somatic dysfunction of thoracic region: Secondary | ICD-10-CM | POA: Diagnosis not present

## 2022-04-03 DIAGNOSIS — M9904 Segmental and somatic dysfunction of sacral region: Secondary | ICD-10-CM | POA: Diagnosis not present

## 2022-04-03 DIAGNOSIS — M9901 Segmental and somatic dysfunction of cervical region: Secondary | ICD-10-CM | POA: Diagnosis not present

## 2022-04-03 NOTE — Assessment & Plan Note (Signed)
Patient is being scheduled for a knee replacement in the near future.  They are still going back and forth for unilateral compartment or full knee.  When reviewing patient's previous MRI in great detail patient does mostly have the medial compartment but there is some narrowing for sure of the patellofemoral and lateral as well.

## 2022-04-03 NOTE — Assessment & Plan Note (Signed)
Stable but pain, discussed HEP, will be having knee replacement  Will likely be better after not having to compensate for the knee.  We will follow-up with me again in 6 weeks after surgery.

## 2022-04-03 NOTE — Progress Notes (Addendum)
Office Visit Note   Patient: Joshua [name]           Date of Birth: 1970/12/25           MRN: [id number] Visit Date: 04/03/2022              Requested by: Lyndal Pulley, DO [place],  [place] [address] PCP: Pcp, No   Assessment & Plan: Visit Diagnoses:  1. Primary osteoarthritis of right knee     Plan: Impression is isolated medial compartment OA.  Based on x-rays and MRI he has bone-on-bone there.  The other chondral surfaces appear to be intact.  Given these findings and the fact that he is he has good bone quality and very active I think he would be a good candidate for partial knee replacement although he understands that based on intraoperative findings may make the decision to do a total knee replacement if I feel that it is appropriate.  Risk benefits rehab recovery prognosis reviewed with the patient in detail.  He will meet with Debbie to look at the schedule.  Questions encouraged and answered.  Follow-Up Instructions: No follow-ups on file.   Orders:  Orders Placed This Encounter  Procedures   XR KNEE 3 VIEW RIGHT   No orders of the defined types were placed in this encounter.     Procedures: No procedures performed   Clinical Data: No additional findings.   Subjective: Chief Complaint  Patient presents with   Right Knee - Pain    HPI Joshua is a very pleasant 51 year old gentleman referral from Dr. Tamala Julian for chronic severe right knee pain.  He has had years knee pain and has been managing with cortisone and gel injections as well as activity modifications and over-the-counter medications.  Has had a prior knee scope as well.  He is very active and hunts mousse as well as playing music and does jujitsu.  He reports medial sided knee pain.  Review of Systems  Constitutional: Negative.   All other systems reviewed and are negative.    Objective: Vital Signs: Ht 5\' 10"  (1.778 m)   Wt 200 lb (90.7 kg)   BMI 28.70 kg/m   Physical  Exam Vitals and nursing note reviewed.  Constitutional:      Appearance: He is well-developed.  HENT:     Head: Normocephalic and atraumatic.  Eyes:     Pupils: Pupils are equal, round, and reactive to light.  Pulmonary:     Effort: Pulmonary effort is normal.  Abdominal:     Palpations: Abdomen is soft.  Musculoskeletal:        General: Normal range of motion.     Cervical back: Neck supple.  Skin:    General: Skin is warm.  Neurological:     Mental Status: He is alert and oriented to person, place, and time.  Psychiatric:        Behavior: Behavior normal.        Thought Content: Thought content normal.        Judgment: Judgment normal.     Ortho Exam Examination of the right knee shows no joint effusion.  Medial joint line tenderness.  Collaterals and cruciates are stable.  Patella tracking is normal.  There is no patellofemoral crepitus. Specialty Comments:  No specialty comments available.  Imaging: See separate dictation  PMFS History: Patient Active Problem List   Diagnosis Date Noted   Primary osteoarthritis of right knee 04/03/2022   Lumbar  radiculopathy 03/04/2022   Urge incontinence of urine 04/30/2021   Degenerative arthritis of knee, bilateral 03/15/2021   SI (sacroiliac) joint dysfunction 12/14/2020   Somatic dysfunction of spine, sacral 12/14/2020   Past Medical History:  Diagnosis Date   Alcoholism (HCC)    Anxiety and depression    BPH with obstruction/lower urinary tract symptoms    Chronic back pain    sports med   Chronic pain of right knee    Chronic pain of right knee    multiple surgeries.  Baker's cyst aspirated/injected by Dr. Katrinka Blazing 2022-->good results   Hypogonadism in male    OAB (overactive bladder)    urol   Stenosing tenosynovitis    R thumb and L middle finger-->good response to inj by ortho 07/2020    No family history on file.  Past Surgical History:  Procedure Laterality Date   righ knee surgery     5+.  Meniscectomy  among others   Social History   Occupational History   Not on file  Tobacco Use   Smoking status: Not on file   Smokeless tobacco: Not on file  Substance and Sexual Activity   Alcohol use: Not on file   Drug use: Not on file   Sexual activity: Not on file

## 2022-04-11 ENCOUNTER — Other Ambulatory Visit: Payer: Self-pay

## 2022-04-17 ENCOUNTER — Other Ambulatory Visit: Payer: Self-pay | Admitting: Family Medicine

## 2022-04-29 NOTE — Pre-Procedure Instructions (Signed)
Surgical Instructions    Your procedure is scheduled on Monday, May 12, 2022 at 7:15 AM.  Report to [facility] Main Entrance "A" at 5:30 A.M., then check in with the Admitting office.  Call this number if you have problems the morning of surgery:  [phone]   If you have any questions prior to your surgery date call [phone]: Open Monday-Friday 8am-4pm  *If you experience any cold or flu symptoms such as cough, fever, chills, shortness of breath, etc. between now and your scheduled surgery, please notify us.*    Remember:  Do not eat after midnight the night before your surgery  You may drink clear liquids until 4:15 AM the morning of your surgery.   Clear liquids allowed are: Water, Non-Citrus Juices (without pulp), Carbonated Beverages, Clear Tea, Black Coffee Only (NO MILK, CREAM OR POWDERED CREAMER of any kind), and Gatorade.    Take these medicines the morning of surgery with A SIP OF WATER:  silodosin (RAPAFLO)  gabapentin (NEURONTIN) - if needed  Patient Instructions  The night before surgery:  No food after midnight. ONLY clear liquids after midnight  The day of surgery (if you do NOT have diabetes):  Drink ONE (1) Pre-Surgery Clear Ensure by 4:15 AM the morning of surgery. Drink in one sitting. Do not sip.  This drink was given to you during your hospital  pre-op appointment visit.  Nothing else to drink after completing the  Pre-Surgery Clear Ensure.         If you have questions, please contact your surgeon's office.   As of today, STOP taking any Aspirin (unless otherwise instructed by your surgeon) Aleve, Naproxen, Ibuprofen, Motrin, Advil, Goody's, BC's, all herbal medications, fish oil, and all vitamins.                     Do NOT Smoke (Tobacco/Vaping) for 24 hours prior to your procedure.  If you use a CPAP at night, you may bring your mask/headgear for your overnight stay.   Contacts, glasses, piercing's, hearing aid's, dentures or  partials may not be worn into surgery, please bring cases for these belongings.    For patients admitted to the hospital, discharge time will be determined by your treatment team.   Patients discharged the day of surgery will not be allowed to drive home, and someone needs to stay with them for 24 hours.  SURGICAL WAITING ROOM VISITATION Patients having surgery or a procedure may have two support people in the waiting area. Visitors may stay in the waiting area during the procedure and switch out with other visitors if needed. Children under the age of 32 must have an adult accompany them who is not the patient. If the patient needs to stay at the hospital during part of their recovery, the visitor guidelines for inpatient rooms apply.  Please refer to the [facility] website for the visitor guidelines for Inpatients (after your surgery is over and you are in a regular room).    Special instructions:   [facility]- Preparing For Surgery  Before surgery, you can play an important role. Because skin is not sterile, your skin needs to be as free of germs as possible. You can reduce the number of germs on your skin by washing with CHG (chlorahexidine gluconate) Soap before surgery.  CHG is an antiseptic cleaner which kills germs and bonds with the skin to continue killing germs even after washing.    Oral Hygiene is also important to  reduce your risk of infection.  Remember - BRUSH YOUR TEETH THE MORNING OF SURGERY WITH YOUR REGULAR TOOTHPASTE  Please do not use if you have an allergy to CHG or antibacterial soaps. If your skin becomes reddened/irritated stop using the CHG.  Do not shave (including legs and underarms) for at least 48 hours prior to first CHG shower. It is OK to shave your face.  Please follow these instructions carefully.   Shower the NIGHT BEFORE SURGERY and the MORNING OF SURGERY  If you chose to wash your hair, wash your hair first as usual with your normal  shampoo.  After you shampoo, rinse your hair and body thoroughly to remove the shampoo.  Use CHG Soap as you would any other liquid soap. You can apply CHG directly to the skin and wash gently with a scrungie or a clean washcloth.   Apply the CHG Soap to your body ONLY FROM THE NECK DOWN.  Do not use on open wounds or open sores. Avoid contact with your eyes, ears, mouth and genitals (private parts). Wash Face and genitals (private parts)  with your normal soap.   Wash thoroughly, paying special attention to the area where your surgery will be performed.  Thoroughly rinse your body with warm water from the neck down.  DO NOT shower/wash with your normal soap after using and rinsing off the CHG Soap.  Pat yourself dry with a CLEAN TOWEL.  Wear CLEAN PAJAMAS to bed the night before surgery  Place CLEAN SHEETS on your bed the night before your surgery  DO NOT SLEEP WITH PETS.   Day of Surgery: Take a shower with CHG soap. Do not wear jewelry. Do not wear lotions, powders, perfumes/colognes, or deodorant. Men may shave face and neck. Do not bring valuables to the hospital.  [facility] is not responsible for any belongings or valuables. Wear Clean/Comfortable clothing the morning of surgery Do not apply any deodorants/lotions.   Remember to brush your teeth WITH YOUR REGULAR TOOTHPASTE.   Please read over the following fact sheets that you were given.  If you received a COVID test during your pre-op visit  it is requested that you wear a mask when out in public, stay away from anyone that may not be feeling well and notify your surgeon if you develop symptoms. If you have been in contact with anyone that has tested positive in the last 10 days please notify you surgeon.

## 2022-04-30 ENCOUNTER — Encounter (HOSPITAL_COMMUNITY)
Admission: RE | Admit: 2022-04-30 | Discharge: 2022-04-30 | Disposition: A | Discharge status: Home / Self Care | Payer: 59 | Source: Ambulatory Visit | Attending: Orthopaedic Surgery | Admitting: Orthopaedic Surgery

## 2022-04-30 ENCOUNTER — Encounter (HOSPITAL_COMMUNITY): Payer: Self-pay

## 2022-04-30 ENCOUNTER — Other Ambulatory Visit: Payer: Self-pay

## 2022-04-30 VITALS — BP 136/94 | HR 71 | Temp 97.9°F | Resp 17 | Ht 70.0 in | Wt 207.4 lb

## 2022-04-30 DIAGNOSIS — Z01812 Encounter for preprocedural laboratory examination: Secondary | ICD-10-CM | POA: Diagnosis present

## 2022-04-30 DIAGNOSIS — M1711 Unilateral primary osteoarthritis, right knee: Secondary | ICD-10-CM | POA: Insufficient documentation

## 2022-04-30 DIAGNOSIS — Z01818 Encounter for other preprocedural examination: Secondary | ICD-10-CM

## 2022-04-30 HISTORY — DX: Personal history of urinary calculi: Z87.442

## 2022-04-30 HISTORY — DX: Sleep apnea, unspecified: G47.30

## 2022-04-30 LAB — CBC
HCT: 53.1 % — ABNORMAL HIGH (ref 39.0–52.0)
Hemoglobin: 17.9 g/dL — ABNORMAL HIGH (ref 13.0–17.0)
MCH: 32.4 pg (ref 26.0–34.0)
MCHC: 33.7 g/dL (ref 30.0–36.0)
MCV: 96 fL (ref 80.0–100.0)
Platelets: 240 10*3/uL (ref 150–400)
RBC: 5.53 MIL/uL (ref 4.22–5.81)
RDW: 12.6 % (ref 11.5–15.5)
WBC: 7.2 10*3/uL (ref 4.0–10.5)
nRBC: 0 % (ref 0.0–0.2)

## 2022-04-30 LAB — SURGICAL PCR SCREEN
MRSA, PCR: NEGATIVE
Staphylococcus aureus: NEGATIVE

## 2022-04-30 LAB — BASIC METABOLIC PANEL
Anion gap: 3 — ABNORMAL LOW (ref 5–15)
BUN: 14 mg/dL (ref 6–20)
CO2: 26 mmol/L (ref 22–32)
Calcium: 8.9 mg/dL (ref 8.9–10.3)
Chloride: 108 mmol/L (ref 98–111)
Creatinine, Ser: 1.26 mg/dL — ABNORMAL HIGH (ref 0.61–1.24)
GFR, Estimated: >60 mL/min (ref >60)
Glucose, Bld: 102 mg/dL — ABNORMAL HIGH (ref 70–99)
Potassium: 4.3 mmol/L (ref 3.5–5.1)
Sodium: 137 mmol/L (ref 135–145)

## 2022-04-30 NOTE — Progress Notes (Signed)
PCP - Elder Negus NP Cardiologist - denies   PPM/ICD - denies Device Orders -  Rep Notified -   Chest x-ray - na EKG -na  Stress Test - na ECHO - na Cardiac Cath -na   Sleep Study - yes -pt unsure when; could not find results in EPIC. Pt states he uses CPAP "most" nights. CPAP - yes  Fasting Blood Sugar - na Checks Blood Sugar _____ times a day  Last dose of GLP1 agonist-  na GLP1 instructions:   Blood Thinner Instructions:na Aspirin Instructions:na  ERAS Protcol -clear liquids until 0415 PRE-SURGERY Ensure or G2- Ensure  COVID TEST- yes   Anesthesia review: no  Patient denies shortness of breath, fever, cough and chest pain at PAT appointment   All instructions explained to the patient, with a verbal understanding of the material. Patient agrees to go over the instructions while at home for a better understanding. Patient also instructed to wear a mask when out in public prior to surgery. The opportunity to ask questions was provided.

## 2022-05-03 ENCOUNTER — Encounter: Payer: Self-pay | Admitting: Family Medicine

## 2022-05-05 ENCOUNTER — Other Ambulatory Visit: Payer: Self-pay | Admitting: Physician Assistant

## 2022-05-05 MED ORDER — ASPIRIN 81 MG PO TBEC: 81.0000 mg | DELAYED_RELEASE_TABLET | Freq: Two times a day (BID) | ORAL | 0 refills | Status: AC | PRN

## 2022-05-05 MED ORDER — ONDANSETRON HCL 4 MG PO TABS: 4.0000 mg | ORAL_TABLET | Freq: Three times a day (TID) | ORAL | 0 refills | Status: DC | PRN

## 2022-05-05 MED ORDER — DOCUSATE SODIUM 100 MG PO CAPS: 100.0000 mg | ORAL_CAPSULE | Freq: Every day | ORAL | 2 refills | Status: AC | PRN

## 2022-05-05 MED ORDER — OXYCODONE-ACETAMINOPHEN 5-325 MG PO TABS: 1.0000 | ORAL_TABLET | Freq: Four times a day (QID) | ORAL | 0 refills | Status: DC | PRN

## 2022-05-05 MED ORDER — METHOCARBAMOL 750 MG PO TABS: 750.0000 mg | ORAL_TABLET | Freq: Two times a day (BID) | ORAL | 2 refills | Status: DC | PRN

## 2022-05-11 NOTE — Anesthesia Preprocedure Evaluation (Signed)
Anesthesia Evaluation  Patient identified by MRN, date of birth, ID band Patient awake    Reviewed: Allergy & Precautions, NPO status , Patient's Chart, lab work & pertinent test results  Airway Mallampati: II  TM Distance: >3 FB Neck ROM: Full    Dental no notable dental hx.    Pulmonary sleep apnea    Pulmonary exam normal        Cardiovascular negative cardio ROS  Rhythm:Regular Rate:Normal     Neuro/Psych   Anxiety Depression    negative neurological ROS     GI/Hepatic negative GI ROS, Neg liver ROS,,,  Endo/Other  negative endocrine ROS    Renal/GU negative Renal ROS     Musculoskeletal  (+) Arthritis , Osteoarthritis,    Abdominal Normal abdominal exam  (+)   Peds  Hematology negative hematology ROS (+)   Anesthesia Other Findings   Reproductive/Obstetrics                             Anesthesia Physical Anesthesia Plan  ASA: 2  Anesthesia Plan: MAC, Regional and Spinal   Post-op Pain Management: Regional block*   Induction: Intravenous  PONV Risk Score and Plan: 1 and Ondansetron, Dexamethasone, Midazolam and Treatment may vary due to age or medical condition  Airway Management Planned: Simple Face Mask, Natural Airway and Nasal Cannula  Additional Equipment: None  Intra-op Plan:   Post-operative Plan:   Informed Consent: I have reviewed the patients History and Physical, chart, labs and discussed the procedure including the risks, benefits and alternatives for the proposed anesthesia with the patient or authorized representative who has indicated his/her understanding and acceptance.     Dental advisory given  Plan Discussed with: CRNA  Anesthesia Plan Comments: (Lab Results      Component                Value               Date                      WBC                      7.2                 04/30/2022                HGB                      17.9 (H)             04/30/2022                HCT                      53.1 (H)            04/30/2022                MCV                      96.0                04/30/2022                PLT  240                 04/30/2022            Lab Results      Component                Value               Date                      NA                       137                 04/30/2022                K                        4.3                 04/30/2022                CO2                      26                  04/30/2022                GLUCOSE                  102 (H)             04/30/2022                BUN                      14                  04/30/2022                CREATININE               1.26 (H)            04/30/2022                CALCIUM                  8.9                 04/30/2022                GFRNONAA                 >60                 04/30/2022           )       Anesthesia Quick Evaluation

## 2022-05-11 NOTE — Discharge Instructions (Addendum)

## 2022-05-12 ENCOUNTER — Ambulatory Visit (HOSPITAL_COMMUNITY)
Admission: RE | Admit: 2022-05-12 | Discharge: 2022-05-12 | Disposition: A | Discharge status: Home / Self Care | Payer: 59 | Attending: Orthopaedic Surgery | Admitting: Orthopaedic Surgery

## 2022-05-12 ENCOUNTER — Other Ambulatory Visit: Payer: Self-pay

## 2022-05-12 ENCOUNTER — Encounter (HOSPITAL_COMMUNITY)
Admission: RE | Disposition: A | Discharge status: Home / Self Care | Payer: Self-pay | Source: Home / Self Care | Attending: Orthopaedic Surgery

## 2022-05-12 ENCOUNTER — Ambulatory Visit (HOSPITAL_COMMUNITY): Payer: 59

## 2022-05-12 ENCOUNTER — Ambulatory Visit (HOSPITAL_BASED_OUTPATIENT_CLINIC_OR_DEPARTMENT_OTHER): Payer: 59 | Admitting: Anesthesiology

## 2022-05-12 ENCOUNTER — Ambulatory Visit (HOSPITAL_COMMUNITY): Payer: 59 | Admitting: Anesthesiology

## 2022-05-12 ENCOUNTER — Encounter (HOSPITAL_COMMUNITY): Payer: Self-pay | Admitting: Orthopaedic Surgery

## 2022-05-12 DIAGNOSIS — G4730 Sleep apnea, unspecified: Secondary | ICD-10-CM | POA: Diagnosis not present

## 2022-05-12 DIAGNOSIS — F418 Other specified anxiety disorders: Secondary | ICD-10-CM | POA: Diagnosis not present

## 2022-05-12 DIAGNOSIS — M1711 Unilateral primary osteoarthritis, right knee: Secondary | ICD-10-CM

## 2022-05-12 HISTORY — PX: PARTIAL KNEE ARTHROPLASTY: SHX2174

## 2022-05-12 SURGERY — ARTHROPLASTY, KNEE, UNICOMPARTMENTAL
Anesthesia: Monitor Anesthesia Care | Site: Knee | Laterality: Right

## 2022-05-12 MED ORDER — FENTANYL CITRATE (PF) 250 MCG/5ML IJ SOLN
INTRAMUSCULAR | Status: AC
  Filled 2022-05-12: qty 5

## 2022-05-12 MED ORDER — METOCLOPRAMIDE HCL 5 MG/ML IJ SOLN: 5.0000 mg | Freq: Three times a day (TID) | INTRAMUSCULAR | Status: DC | PRN

## 2022-05-12 MED ORDER — ONDANSETRON HCL 4 MG/2ML IJ SOLN
INTRAMUSCULAR | Status: AC
  Filled 2022-05-12: qty 2

## 2022-05-12 MED ORDER — HYDROMORPHONE HCL 1 MG/ML IJ SOLN: 0.5000 mg | INTRAMUSCULAR | Status: DC | PRN

## 2022-05-12 MED ORDER — KETOROLAC TROMETHAMINE 15 MG/ML IJ SOLN
INTRAMUSCULAR | Status: AC
  Filled 2022-05-12: qty 1

## 2022-05-12 MED ORDER — PHENYLEPHRINE HCL-NACL 20-0.9 MG/250ML-% IV SOLN
INTRAVENOUS | Status: DC | PRN
  Administered 2022-05-12: 30 ug/min via INTRAVENOUS

## 2022-05-12 MED ORDER — ACETAMINOPHEN 325 MG PO TABS: 325.0000 mg | ORAL_TABLET | Freq: Four times a day (QID) | ORAL | Status: DC | PRN

## 2022-05-12 MED ORDER — METHOCARBAMOL 500 MG PO TABS: 500.0000 mg | ORAL_TABLET | Freq: Four times a day (QID) | ORAL | Status: DC | PRN

## 2022-05-12 MED ORDER — TRANEXAMIC ACID-NACL 1000-0.7 MG/100ML-% IV SOLN
1000.0000 mg | Freq: Once | INTRAVENOUS | Status: DC
  Filled 2022-05-12: qty 100

## 2022-05-12 MED ORDER — OXYCODONE HCL 5 MG PO TABS
ORAL_TABLET | ORAL | Status: AC
  Filled 2022-05-12: qty 1

## 2022-05-12 MED ORDER — POVIDONE-IODINE 10 % EX SWAB: 2.0000 | Freq: Once | CUTANEOUS | Status: DC

## 2022-05-12 MED ORDER — LACTATED RINGERS IV BOLUS: 250.0000 mL | Freq: Once | INTRAVENOUS | Status: DC

## 2022-05-12 MED ORDER — BUPIVACAINE-MELOXICAM ER 400-12 MG/14ML IJ SOLN
INTRAMUSCULAR | Status: DC | PRN
  Administered 2022-05-12: 400 mg

## 2022-05-12 MED ORDER — FENTANYL CITRATE (PF) 100 MCG/2ML IJ SOLN: 25.0000 ug | INTRAMUSCULAR | Status: DC | PRN

## 2022-05-12 MED ORDER — PHENYLEPHRINE 80 MCG/ML (10ML) SYRINGE FOR IV PUSH (FOR BLOOD PRESSURE SUPPORT)
PREFILLED_SYRINGE | INTRAVENOUS | Status: AC
  Filled 2022-05-12: qty 10

## 2022-05-12 MED ORDER — METHOCARBAMOL 500 MG PO TABS
ORAL_TABLET | ORAL | Status: AC
  Filled 2022-05-12: qty 1

## 2022-05-12 MED ORDER — MIDAZOLAM HCL 2 MG/2ML IJ SOLN
INTRAMUSCULAR | Status: DC | PRN
  Administered 2022-05-12: 2 mg via INTRAVENOUS

## 2022-05-12 MED ORDER — TAMSULOSIN HCL 0.4 MG PO CAPS: 0.4000 mg | ORAL_CAPSULE | ORAL | Status: DC

## 2022-05-12 MED ORDER — LACTATED RINGERS IV SOLN: INTRAVENOUS | Status: DC

## 2022-05-12 MED ORDER — ROPIVACAINE HCL 7.5 MG/ML IJ SOLN
INTRAMUSCULAR | Status: DC | PRN
  Administered 2022-05-12: 20 mL via PERINEURAL

## 2022-05-12 MED ORDER — OXYCODONE HCL 5 MG PO TABS
10.0000 mg | ORAL_TABLET | ORAL | Status: DC | PRN
  Administered 2022-05-12: 10 mg via ORAL

## 2022-05-12 MED ORDER — VANCOMYCIN HCL 1000 MG IV SOLR
INTRAVENOUS | Status: AC
  Filled 2022-05-12: qty 20

## 2022-05-12 MED ORDER — LACTATED RINGERS IV BOLUS: 500.0000 mL | Freq: Once | INTRAVENOUS | Status: DC

## 2022-05-12 MED ORDER — OXYCODONE HCL ER 10 MG PO T12A: 10.0000 mg | EXTENDED_RELEASE_TABLET | Freq: Two times a day (BID) | ORAL | Status: DC

## 2022-05-12 MED ORDER — METOCLOPRAMIDE HCL 5 MG PO TABS: 5.0000 mg | ORAL_TABLET | Freq: Three times a day (TID) | ORAL | Status: DC | PRN

## 2022-05-12 MED ORDER — OXYCODONE HCL 5 MG PO TABS: 5.0000 mg | ORAL_TABLET | ORAL | Status: DC | PRN

## 2022-05-12 MED ORDER — BUPIVACAINE IN DEXTROSE 0.75-8.25 % IT SOLN
INTRATHECAL | Status: DC | PRN
  Administered 2022-05-12: 2 mL via INTRATHECAL

## 2022-05-12 MED ORDER — 0.9 % SODIUM CHLORIDE (POUR BTL) OPTIME
TOPICAL | Status: DC | PRN
  Administered 2022-05-12: 1000 mL

## 2022-05-12 MED ORDER — SODIUM CHLORIDE 0.9 % IR SOLN
Status: DC | PRN
  Administered 2022-05-12: 1000 mL

## 2022-05-12 MED ORDER — CEFAZOLIN SODIUM-DEXTROSE 2-4 GM/100ML-% IV SOLN: 2.0000 g | Freq: Four times a day (QID) | INTRAVENOUS | Status: DC

## 2022-05-12 MED ORDER — KETOROLAC TROMETHAMINE 15 MG/ML IJ SOLN: 15.0000 mg | Freq: Four times a day (QID) | INTRAMUSCULAR | Status: DC

## 2022-05-12 MED ORDER — LIDOCAINE 2% (20 MG/ML) 5 ML SYRINGE
INTRAMUSCULAR | Status: AC
  Filled 2022-05-12: qty 5

## 2022-05-12 MED ORDER — TRANEXAMIC ACID-NACL 1000-0.7 MG/100ML-% IV SOLN
1000.0000 mg | INTRAVENOUS | Status: AC
  Administered 2022-05-12: 1000 mg via INTRAVENOUS
  Filled 2022-05-12: qty 100

## 2022-05-12 MED ORDER — ONDANSETRON HCL 4 MG PO TABS: 4.0000 mg | ORAL_TABLET | Freq: Four times a day (QID) | ORAL | Status: DC | PRN

## 2022-05-12 MED ORDER — FENTANYL CITRATE (PF) 250 MCG/5ML IJ SOLN
INTRAMUSCULAR | Status: DC | PRN
  Administered 2022-05-12: 50 ug via INTRAVENOUS

## 2022-05-12 MED ORDER — METOCLOPRAMIDE HCL 5 MG/ML IJ SOLN
INTRAMUSCULAR | Status: AC
  Filled 2022-05-12: qty 2

## 2022-05-12 MED ORDER — KETOROLAC TROMETHAMINE 15 MG/ML IJ SOLN
15.0000 mg | Freq: Four times a day (QID) | INTRAMUSCULAR | Status: DC
  Administered 2022-05-12: 15 mg via INTRAVENOUS

## 2022-05-12 MED ORDER — ACETAMINOPHEN 500 MG PO TABS: 1000.0000 mg | ORAL_TABLET | Freq: Four times a day (QID) | ORAL | Status: DC

## 2022-05-12 MED ORDER — LIDOCAINE 2% (20 MG/ML) 5 ML SYRINGE
INTRAMUSCULAR | Status: DC | PRN
  Administered 2022-05-12: 40 mg via INTRAVENOUS

## 2022-05-12 MED ORDER — PROPOFOL 10 MG/ML IV BOLUS
INTRAVENOUS | Status: DC | PRN
  Administered 2022-05-12: 50 mg via INTRAVENOUS

## 2022-05-12 MED ORDER — TRANEXAMIC ACID 1000 MG/10ML IV SOLN
INTRAVENOUS | Status: DC | PRN
  Administered 2022-05-12: 2000 mg via TOPICAL

## 2022-05-12 MED ORDER — METOCLOPRAMIDE HCL 5 MG/ML IJ SOLN
5.0000 mg | Freq: Three times a day (TID) | INTRAMUSCULAR | Status: DC | PRN
  Administered 2022-05-12: 10 mg via INTRAVENOUS

## 2022-05-12 MED ORDER — VANCOMYCIN HCL 1000 MG IV SOLR
INTRAVENOUS | Status: DC | PRN
  Administered 2022-05-12: 1000 mg

## 2022-05-12 MED ORDER — OXYCODONE HCL 5 MG PO TABS
ORAL_TABLET | ORAL | Status: AC
  Filled 2022-05-12: qty 2

## 2022-05-12 MED ORDER — OXYCODONE HCL 5 MG PO TABS
ORAL_TABLET | ORAL | Status: AC
  Administered 2022-05-12: 10 mg via ORAL
  Filled 2022-05-12: qty 1

## 2022-05-12 MED ORDER — OXYCODONE HCL 5 MG PO TABS: 10.0000 mg | ORAL_TABLET | ORAL | Status: DC | PRN

## 2022-05-12 MED ORDER — MIDAZOLAM HCL 2 MG/2ML IJ SOLN
INTRAMUSCULAR | Status: AC
  Filled 2022-05-12: qty 2

## 2022-05-12 MED ORDER — TRANEXAMIC ACID 1000 MG/10ML IV SOLN
2000.0000 mg | INTRAVENOUS | Status: DC
  Filled 2022-05-12: qty 20

## 2022-05-12 MED ORDER — CEFAZOLIN SODIUM-DEXTROSE 2-4 GM/100ML-% IV SOLN
2.0000 g | Freq: Four times a day (QID) | INTRAVENOUS | Status: DC
  Administered 2022-05-12: 2 g via INTRAVENOUS
  Filled 2022-05-12: qty 100

## 2022-05-12 MED ORDER — BUPIVACAINE-MELOXICAM ER 400-12 MG/14ML IJ SOLN
INTRAMUSCULAR | Status: AC
  Filled 2022-05-12: qty 1

## 2022-05-12 MED ORDER — ONDANSETRON HCL 4 MG/2ML IJ SOLN: 4.0000 mg | Freq: Four times a day (QID) | INTRAMUSCULAR | Status: DC | PRN

## 2022-05-12 MED ORDER — CHLORHEXIDINE GLUCONATE 0.12 % MT SOLN
15.0000 mL | OROMUCOSAL | Status: AC
  Administered 2022-05-12: 15 mL via OROMUCOSAL
  Filled 2022-05-12: qty 15

## 2022-05-12 MED ORDER — PRONTOSAN WOUND IRRIGATION OPTIME
TOPICAL | Status: DC | PRN
  Administered 2022-05-12: 1

## 2022-05-12 MED ORDER — TRANEXAMIC ACID-NACL 1000-0.7 MG/100ML-% IV SOLN
1000.0000 mg | Freq: Once | INTRAVENOUS | Status: AC
  Administered 2022-05-12: 1000 mg via INTRAVENOUS
  Filled 2022-05-12: qty 100

## 2022-05-12 MED ORDER — PROPOFOL 500 MG/50ML IV EMUL
INTRAVENOUS | Status: DC | PRN
  Administered 2022-05-12 (×2): 100 ug/kg/min via INTRAVENOUS

## 2022-05-12 MED ORDER — METHOCARBAMOL 1000 MG/10ML IJ SOLN: 500.0000 mg | Freq: Four times a day (QID) | INTRAVENOUS | Status: DC | PRN

## 2022-05-12 MED ORDER — ONDANSETRON HCL 4 MG/2ML IJ SOLN
INTRAMUSCULAR | Status: AC
  Administered 2022-05-12: 4 mg via INTRAVENOUS
  Filled 2022-05-12: qty 2

## 2022-05-12 MED ORDER — METHOCARBAMOL 500 MG PO TABS
500.0000 mg | ORAL_TABLET | Freq: Four times a day (QID) | ORAL | Status: DC | PRN
  Administered 2022-05-12: 500 mg via ORAL

## 2022-05-12 MED ORDER — ACETAMINOPHEN 10 MG/ML IV SOLN
1000.0000 mg | Freq: Once | INTRAVENOUS | Status: DC | PRN
  Administered 2022-05-12: 1000 mg via INTRAVENOUS

## 2022-05-12 MED ORDER — CEFAZOLIN SODIUM-DEXTROSE 2-4 GM/100ML-% IV SOLN
2.0000 g | INTRAVENOUS | Status: AC
  Administered 2022-05-12: 2 g via INTRAVENOUS
  Filled 2022-05-12: qty 100

## 2022-05-12 MED ORDER — ACETAMINOPHEN 10 MG/ML IV SOLN
INTRAVENOUS | Status: AC
  Filled 2022-05-12: qty 100

## 2022-05-12 SURGICAL SUPPLY — 77 items
ALCOHOL 70% 16 OZ (MISCELLANEOUS) ×1 IMPLANT
BAG COUNTER SPONGE SURGICOUNT (BAG) ×1 IMPLANT
BAG DECANTER FOR FLEXI CONT (MISCELLANEOUS) ×1 IMPLANT
BANDAGE ESMARK 6X9 LF (GAUZE/BANDAGES/DRESSINGS) IMPLANT
BLADE SAG 18X100X1.27 (BLADE) IMPLANT
BLADE SAW RECIP 87.9 MT (BLADE) IMPLANT
BLADE SAW SGTL 13.0X1.19X90.0M (BLADE) IMPLANT
BNDG ESMARK 6X9 LF (GAUZE/BANDAGES/DRESSINGS)
BOWL SMART MIX CTS (DISPOSABLE) ×1 IMPLANT
CEMENT BONE REFOBACIN R1X40 US (Cement) IMPLANT
CLSR STERI-STRIP ANTIMIC 1/2X4 (GAUZE/BANDAGES/DRESSINGS) IMPLANT
COOLER ICEMAN CLASSIC (MISCELLANEOUS) ×1 IMPLANT
COVER SURGICAL LIGHT HANDLE (MISCELLANEOUS) ×1 IMPLANT
CUFF TOURN SGL QUICK 42 (TOURNIQUET CUFF) IMPLANT
DRAPE EXTREMITY T 121X128X90 (DISPOSABLE) ×1 IMPLANT
DRAPE HALF SHEET 40X57 (DRAPES) ×1 IMPLANT
DRAPE INCISE IOBAN 66X45 STRL (DRAPES) IMPLANT
DRAPE ORTHO SPLIT 77X108 STRL (DRAPES) ×2
DRAPE POUCH INSTRU U-SHP 10X18 (DRAPES) ×1 IMPLANT
DRAPE SURG ORHT 6 SPLT 77X108 (DRAPES) ×2 IMPLANT
DRAPE U-SHAPE 47X51 STRL (DRAPES) ×2 IMPLANT
DRSG AQUACEL AG ADV 3.5X10 (GAUZE/BANDAGES/DRESSINGS) IMPLANT
DURAPREP 26ML APPLICATOR (WOUND CARE) ×3 IMPLANT
ELECT CAUTERY BLADE 6.4 (BLADE) ×1 IMPLANT
ELECT REM PT RETURN 9FT ADLT (ELECTROSURGICAL) ×1
ELECTRODE REM PT RTRN 9FT ADLT (ELECTROSURGICAL) ×1 IMPLANT
GLOVE BIOGEL PI IND STRL 7.0 (GLOVE) ×2 IMPLANT
GLOVE BIOGEL PI IND STRL 7.5 (GLOVE) ×5 IMPLANT
GLOVE SURG SYN 7.5  E (GLOVE) ×2
GLOVE SURG SYN 7.5 E (GLOVE) ×2 IMPLANT
GLOVE SURG SYN 7.5 PF PI (GLOVE) ×2 IMPLANT
GLOVE SURG UNDER POLY LF SZ7 (GLOVE) ×19 IMPLANT
GLOVE SURG UNDER POLY LF SZ7.5 (GLOVE) ×9 IMPLANT
GOWN STRL REIN XL XLG (GOWN DISPOSABLE) ×2 IMPLANT
GOWN STRL REUS W/ TWL LRG LVL3 (GOWN DISPOSABLE) ×1 IMPLANT
GOWN STRL REUS W/TWL LRG LVL3 (GOWN DISPOSABLE) ×1
HANDPIECE INTERPULSE COAX TIP (DISPOSABLE) ×1
HDLS TROCR DRIL PIN KNEE 75 (PIN) ×1
HOOD PEEL AWAY FLYTE STAYCOOL (MISCELLANEOUS) ×2 IMPLANT
INSERT TIB KNEE PS G 9 RT (Insert) IMPLANT
INSERTER TIP PARTIAL KNEE (MISCELLANEOUS) IMPLANT
JET LAVAGE IRRISEPT WOUND (IRRIGATION / IRRIGATOR) ×1
KIT BASIN OR (CUSTOM PROCEDURE TRAY) ×1 IMPLANT
KIT TURNOVER KIT B (KITS) ×1 IMPLANT
KNEE PSN PK CMT FEM RM SZ 5 (Orthopedic Implant) IMPLANT
KNEE PSN PK CMT TIB RM SZ G (Orthopedic Implant) IMPLANT
LAVAGE JET IRRISEPT WOUND (IRRIGATION / IRRIGATOR) ×1 IMPLANT
MANIFOLD NEPTUNE II (INSTRUMENTS) ×1 IMPLANT
MARKER SKIN DUAL TIP RULER LAB (MISCELLANEOUS) ×1 IMPLANT
NDL SPNL 18GX3.5 QUINCKE PK (NEEDLE) ×1 IMPLANT
NEEDLE SPNL 18GX3.5 QUINCKE PK (NEEDLE) ×1 IMPLANT
NS IRRIG 1000ML POUR BTL (IV SOLUTION) ×1 IMPLANT
PACK TOTAL JOINT (CUSTOM PROCEDURE TRAY) ×1 IMPLANT
PAD ARMBOARD 7.5X6 YLW CONV (MISCELLANEOUS) ×2 IMPLANT
PAD COLD SHLDR WRAP-ON (PAD) ×1 IMPLANT
PIN DRILL HDLS TROCAR 75 4PK (PIN) IMPLANT
SAW OSC TIP CART 19.5X105X1.3 (SAW) ×1 IMPLANT
SCREW HEADED 33MM KNEE (MISCELLANEOUS) IMPLANT
SCREW HEADED 48MM KNEE (MISCELLANEOUS) IMPLANT
SET HNDPC FAN SPRY TIP SCT (DISPOSABLE) ×1 IMPLANT
STAPLER VISISTAT 35W (STAPLE) ×1 IMPLANT
SUCTION FRAZIER HANDLE 10FR (MISCELLANEOUS) ×1
SUCTION TUBE FRAZIER 10FR DISP (MISCELLANEOUS) ×1 IMPLANT
SUT MNCRL AB 3-0 PS2 18 (SUTURE) IMPLANT
SUT MNCRL+ AB 3-0 CT1 36 (SUTURE) IMPLANT
SUT MONOCRYL AB 3-0 CT1 36IN (SUTURE) ×1
SUT VIC AB 0 CT1 27 (SUTURE) ×2
SUT VIC AB 0 CT1 27XBRD ANBCTR (SUTURE) ×2 IMPLANT
SUT VIC AB 1 CTX 27 (SUTURE) ×3 IMPLANT
SUT VIC AB 2-0 CT1 27 (SUTURE) ×6
SUT VIC AB 2-0 CT1 TAPERPNT 27 (SUTURE) ×4 IMPLANT
SYR 50ML LL SCALE MARK (SYRINGE) ×2 IMPLANT
TOWEL GREEN STERILE (TOWEL DISPOSABLE) ×1 IMPLANT
TOWEL GREEN STERILE FF (TOWEL DISPOSABLE) ×1 IMPLANT
TRAY FOLEY MTR SLVR 16FR STAT (SET/KITS/TRAYS/PACK) ×1 IMPLANT
UNDERPAD 30X36 HEAVY ABSORB (UNDERPADS AND DIAPERS) ×1 IMPLANT
WRAP KNEE MAXI GEL POST OP (GAUZE/BANDAGES/DRESSINGS) ×1 IMPLANT

## 2022-05-12 NOTE — Evaluation (Signed)
Physical Therapy Evaluation Patient Details Name: Joshua [name] MRN: [id number] DOB: [birth year] Today's Date: 05/12/2022  History of Present Illness  Pt is 51 yo male admitted 05/12/22 for R unicompartmental knee replacement.  Pt with hx of back pain, sleep apnea, multiple R knee surgeries.  Clinical Impression  Pt is s/p TKA resulting in the deficits listed below (see PT Problem List). PT asked to see pt in PACU for possible same day d/c. At baseline, pt is independent and active.  He has RW and support at home with only 2 steps to enter home.  Today, pt demonstrating good quad activation , pain control, and ROM 5 to 80 degrees.  He ambulated 62' with min guard and good stability.  Pt did report some R LE weakness but has good upper body strength to compensate if needed.   Pt demonstrates safe gait & transfers in order to return home from PT perspective once discharged by MD.  While in hospital, will continue to benefit from PT for skilled therapy to advance mobility and exercises.          Recommendations for follow up therapy are one component of a multi-disciplinary discharge planning process, led by the attending physician.  Recommendations may be updated based on patient status, additional functional criteria and insurance authorization.  Follow Up Recommendations Follow physician's recommendations for discharge plan and follow up therapies      Assistance Recommended at Discharge Intermittent Supervision/Assistance  Patient can return home with the following  A little help with walking and/or transfers;A little help with bathing/dressing/bathroom;Assistance with cooking/housework;Help with stairs or ramp for entrance    Equipment Recommendations None recommended by PT  Recommendations for Other Services       Functional Status Assessment Patient has had a recent decline in their functional status and demonstrates the ability to make significant improvements in function in a  reasonable and predictable amount of time.     Precautions / Restrictions Precautions Precautions: None Restrictions Weight Bearing Restrictions: Yes RLE Weight Bearing: Weight bearing as tolerated      Mobility  Bed Mobility Overal bed mobility: Needs Assistance Bed Mobility: Supine to Sit, Sit to Supine     Supine to sit: Supervision Sit to supine: Supervision        Transfers Overall transfer level: Needs assistance Equipment used: Rolling walker (2 wheels) Transfers: Sit to/from Stand Sit to Stand: Min guard           General transfer comment: cues for safe hand placement and R LE management    Ambulation/Gait Ambulation/Gait assistance: Min guard Gait Distance (Feet): 80 Feet Assistive device: Rolling walker (2 wheels) Gait Pattern/deviations: Step-through pattern, Decreased stride length, Decreased weight shift to right Gait velocity: decreased     General Gait Details: Cues for RW proximity and sequencign with good carryover.  Reports knee feels a little weak but no buckling and Pt able to support entire body weight through arms if needed./  Stairs Stairs:  (no stairs in PACU; pt with good upper body strength and confident in his ability to do his 2 steps)          Wheelchair Mobility    Modified Rankin (Stroke Patients Only)       Balance Overall balance assessment: Needs assistance Sitting-balance support: No upper extremity supported Sitting balance-Leahy Scale: Normal     Standing balance support: Bilateral upper extremity supported, No upper extremity supported Standing balance-Leahy Scale: Fair Standing balance comment: could static stand without support but  RW to ambulate                             Pertinent Vitals/Pain Pain Assessment Pain Assessment: 0-10 Pain Score: 6  Pain Location: R knee Pain Descriptors / Indicators: Discomfort (deep) Pain Intervention(s): Limited activity within patient's tolerance,  Monitored during session    Home Living Family/patient expects to be discharged to:: Private residence Living Arrangements: Spouse/significant other;Children Available Help at Discharge: Family;Available 24 hours/day Type of Home: House       Alternate Level Stairs-Number of Steps: 2 Home Layout: Two level;Able to live on main level with bedroom/bathroom Home Equipment: Rolling Walker (2 wheels)      Prior Function Prior Level of Function : Independent/Modified Independent;Working/employed;Driving             Mobility Comments: Acitve and independent; owns a gym; does jitisu; works active job       Higher education careers adviser        Extremity/Trunk Assessment   Upper Extremity Assessment Upper Extremity Assessment: Overall WFL for tasks assessed (very strong UE)    Lower Extremity Assessment Lower Extremity Assessment: LLE deficits/detail;RLE deficits/detail RLE Deficits / Details: Expected post op changes; ROM knee 5 to 80 degrees; MMT 5/5 ankle, 3/5 hip and knee not further tested LLE Deficits / Details: ROM WFL; MMT 5/5    Cervical / Trunk Assessment Cervical / Trunk Assessment: Normal  Communication   Communication: No difficulties  Cognition Arousal/Alertness: Awake/alert Behavior During Therapy: WFL for tasks assessed/performed Overall Cognitive Status: Within Functional Limits for tasks assessed                                          General Comments   Educated on safe ice use, no pivots, car transfers, resting with leg straight, and TED hose during day. Also, encouraged walking every 1-2 hours during day. Educated on HEP with focus on mobility the first weeks. Discussed doing exercises within pain control and if pain increasing could decreased ROM, reps, and stop exercises as needed. Encouraged to perform quad sets and ankle pumps frequently for blood flow and to promote full knee extension.    Exercises Total Joint Exercises Ankle  Circles/Pumps: AROM, Both, 5 reps, Supine Quad Sets: AROM, Both, 5 reps, Supine Heel Slides: AROM, Right, 5 reps, Supine Hip ABduction/ADduction: AROM, Right, 5 reps, Supine Long Arc Quad: AROM, Right, 5 reps, Seated Knee Flexion: AROM, Right, 5 reps, Seated Goniometric ROM: R knee 5 to 80degrees Other Exercises Other Exercises: Cues for exercises as able in the first week.  If too painful could decreased ROM, reps, or exercises with focus on mobility/walking   Assessment/Plan    PT Assessment Patient needs continued PT services  PT Problem List Decreased strength;Pain;Decreased range of motion;Decreased activity tolerance;Decreased balance;Decreased safety awareness;Decreased mobility       PT Treatment Interventions DME instruction;Therapeutic exercise;Gait training;Balance training;Stair training;Modalities;Functional mobility training;Therapeutic activities;Patient/family education    PT Goals (Current goals can be found in the Care Plan section)  Acute Rehab PT Goals Patient Stated Goal: return home PT Goal Formulation: With patient Time For Goal Achievement: 05/26/22 Potential to Achieve Goals: Good    Frequency 7X/week     Co-evaluation               AM-PAC PT "6 Clicks" Mobility  Outcome Measure Help needed turning  from your back to your side while in a flat bed without using bedrails?: None Help needed moving from lying on your back to sitting on the side of a flat bed without using bedrails?: None Help needed moving to and from a bed to a chair (including a wheelchair)?: A Little Help needed standing up from a chair using your arms (e.g., wheelchair or bedside chair)?: A Little Help needed to walk in hospital room?: A Little Help needed climbing 3-5 steps with a railing? : A Little 6 Click Score: 20    End of Session Equipment Utilized During Treatment: Gait belt Activity Tolerance: Patient tolerated treatment well Patient left: in bed;with call bell/phone  within reach (IN PACU) Nurse Communication: Mobility status PT Visit Diagnosis: Other abnormalities of gait and mobility (R26.89);Muscle weakness (generalized) (M62.81)    Time: 7939-0300 PT Time Calculation (min) (ACUTE ONLY): 33 min   Charges:   PT Evaluation $PT Eval Low Complexity: 1 Low PT Treatments $Therapeutic Exercise: 8-22 mins        Anise Salvo, PT Acute Rehab [facility] [phone]   Rayetta Humphrey 05/12/2022, 2:02 PM

## 2022-05-12 NOTE — Anesthesia Procedure Notes (Signed)
Spinal  Patient location during procedure: OR Start time: 05/12/2022 7:22 AM End time: 05/12/2022 7:24 AM Staffing Performed: anesthesiologist  Anesthesiologist: Atilano Median, DO Performed by: Atilano Median, DO Authorized by: Atilano Median, DO   Preanesthetic Checklist Completed: patient identified, IV checked, site marked, risks and benefits discussed, surgical consent, monitors and equipment checked, pre-op evaluation and timeout performed Spinal Block Patient position: sitting Prep: DuraPrep Patient monitoring: heart rate, cardiac monitor, continuous pulse ox and blood pressure Approach: midline Location: L4-5 Injection technique: single-shot Needle Needle type: Pencan  Needle gauge: 24 G Needle length: 10 cm Assessment Events: CSF return Additional Notes Patient identified. Risks/Benefits/Options discussed with patient including but not limited to bleeding, infection, nerve damage, paralysis, failed block, incomplete pain control, headache, blood pressure changes, nausea, vomiting, reactions to medications, itching and postpartum back pain. Confirmed with bedside nurse the patient's most recent platelet count. Confirmed with patient that they are not currently taking any anticoagulation, have any bleeding history or any family history of bleeding disorders. Patient expressed understanding and wished to proceed. All questions were answered. Sterile technique was used throughout the entire procedure. Please see nursing notes for vital signs. Warning signs of high block given to the patient including shortness of breath, tingling/numbness in hands, complete motor block, or any concerning symptoms with instructions to call for help. Patient was given instructions on fall risk and not to get out of bed. All questions and concerns addressed with instructions to call with any issues or inadequate analgesia.

## 2022-05-12 NOTE — Anesthesia Procedure Notes (Signed)
Anesthesia Regional Block: Adductor canal block   Pre-Anesthetic Checklist: , timeout performed,  Correct Patient, Correct Site, Correct Laterality,  Correct Procedure, Correct Position, site marked,  Risks and benefits discussed,  Surgical consent,  Pre-op evaluation,  At surgeon's request and post-op pain management  Laterality: Right  Prep: Dura Prep       Needles:  Injection technique: Single-shot  Needle Type: Echogenic Stimulator Needle     Needle Length: 10cm  Needle Gauge: 20     Additional Needles:   Procedures:,,,, ultrasound used (permanent image in chart),,    Narrative:  Start time: 05/12/2022 7:02 AM End time: 05/12/2022 7:04 AM Injection made incrementally with aspirations every 5 mL.  Performed by: Personally  Anesthesiologist: Atilano Median, DO  Additional Notes: Patient identified. Risks/Benefits/Options discussed with patient including but not limited to bleeding, infection, nerve damage, failed block, incomplete pain control. Patient expressed understanding and wished to proceed. All questions were answered. Sterile technique was used throughout the entire procedure. Please see nursing notes for vital signs. Aspirated in 5cc intervals with injection for negative confirmation. Patient was given instructions on fall risk and not to get out of bed. All questions and concerns addressed with instructions to call with any issues or inadequate analgesia.

## 2022-05-12 NOTE — H&P (Signed)
PREOPERATIVE H&P  Chief Complaint: RIGHT KNEE OSTEOARTHRITIS  HPI: Joshua [name] is a 51 y.o. male who presents for surgical treatment of RIGHT KNEE OSTEOARTHRITIS.  He denies any changes in medical history.  Past Medical History:  Diagnosis Date   Alcoholism (HCC)    Anxiety and depression    BPH with obstruction/lower urinary tract symptoms    Chronic back pain    sports med   Chronic pain of right knee    Chronic pain of right knee    multiple surgeries.  Baker's cyst aspirated/injected by Dr. Katrinka Blazing 2022-->good results   History of kidney stones    Hypogonadism in male    OAB (overactive bladder)    urol   Sleep apnea    Stenosing tenosynovitis    R thumb and L middle finger-->good response to inj by ortho 07/2020   Past Surgical History:  Procedure Laterality Date   righ knee surgery     5+.  Meniscectomy among others   Social History   Socioeconomic History   Marital status: Married    Spouse name: Not on file   Number of children: Not on file   Years of education: Not on file   Highest education level: Not on file  Occupational History   Not on file  Tobacco Use   Smoking status: Never   Smokeless tobacco: Former  Building services engineer Use: Never used  Substance and Sexual Activity   Alcohol use: Yes    Alcohol/week: 3.0 standard drinks of alcohol    Types: 3 Cans of beer per week   Drug use: Not on file   Sexual activity: Not on file  Other Topics Concern   Not on file  Social History Narrative   Not on file   Social Determinants of Health   Financial Resource Strain: Not on file  Food Insecurity: Not on file  Transportation Needs: Not on file  Physical Activity: Not on file  Stress: Not on file  Social Connections: Not on file   No family history on file. No Known Allergies Prior to Admission medications   Medication Sig Start Date End Date Taking? Authorizing Provider  aspirin EC 81 MG tablet Take 1 tablet (81 mg total) by mouth 2 (two)  times daily as needed. To be taken after surgery to prevent blood clots 05/05/22 05/05/23  Cristie Hem, PA-C  docusate sodium (COLACE) 100 MG capsule Take 1 capsule (100 mg total) by mouth daily as needed. 05/05/22 05/05/23  Cristie Hem, PA-C  gabapentin (NEURONTIN) 300 MG capsule Take 300 mg by mouth daily as needed (pain).   Yes [provider]  methocarbamol (ROBAXIN-750) 750 MG tablet Take 1 tablet (750 mg total) by mouth 2 (two) times daily as needed for muscle spasms. 05/05/22   Cristie Hem, PA-C  NON FORMULARY Pt uses a cpap nightly   Yes [provider]  ondansetron (ZOFRAN) 4 MG tablet Take 1 tablet (4 mg total) by mouth every 8 (eight) hours as needed for nausea or vomiting. 05/05/22   Cristie Hem, PA-C  oxyCODONE-acetaminophen (PERCOCET) 5-325 MG tablet Take 1-2 tablets by mouth every 6 (six) hours as needed. To be taken after surgery 05/05/22   Cristie Hem, PA-C  silodosin (RAPAFLO) 8 MG CAPS capsule Take 8 mg by mouth daily.   Yes [provider]  testosterone cypionate (DEPOTESTOSTERONE CYPIONATE) 200 MG/ML injection Inject 100 mg into the muscle every 7 (seven) days.  05/14/21  Yes [provider]  Vitamin D, Ergocalciferol, (DRISDOL) 1.25 MG (50000 UNIT) CAPS capsule TAKE 1 CAPSULE (50,000 UNITS TOTAL) BY MOUTH EVERY 7 (SEVEN) DAYS 03/10/22  Yes [name], DO  doxycycline (VIBRA-TABS) 100 MG tablet Take 1 tablet (100 mg total) by mouth 2 (two) times daily. Patient not taking: Reported on 04/29/2022 03/15/21   [name], DO  gabapentin (NEURONTIN) 100 MG capsule Take 2 capsules (200 mg total) by mouth at bedtime. Patient not taking: Reported on 04/29/2022 03/15/21   [name], DO  meloxicam (MOBIC) 15 MG tablet Take 1 tablet (15 mg total) by mouth daily. Patient not taking: Reported on 04/29/2022 11/14/21   [name], DO  predniSONE (DELTASONE) 50 MG tablet Take 1 tablet (50 mg total) by mouth  daily. Patient not taking: Reported on 04/29/2022 03/04/22   [name], DO     Positive ROS: All other systems have been reviewed and were otherwise negative with the exception of those mentioned in the HPI and as above.  Physical Exam: General: Alert, no acute distress Cardiovascular: No pedal edema Respiratory: No cyanosis, no use of accessory musculature GI: abdomen soft Skin: No lesions in the area of chief complaint Neurologic: Sensation intact distally Psychiatric: Patient is competent for consent with normal mood and affect Lymphatic: no lymphedema  MUSCULOSKELETAL: exam stable  Assessment: RIGHT KNEE OSTEOARTHRITIS  Plan: Plan for Procedure(s): RIGHT UNICOMPARTMENTAL KNEE, POSSIBLE TOTAL KNEE ARTHROPLASTY  The risks benefits and alternatives were discussed with the patient including but not limited to the risks of nonoperative treatment, versus surgical intervention including infection, bleeding, nerve injury,  blood clots, cardiopulmonary complications, morbidity, mortality, among others, and they were willing to proceed.   Glee Arvin, MD 05/12/2022 5:57 AM

## 2022-05-12 NOTE — Transfer of Care (Signed)
Immediate Anesthesia Transfer of Care Note  Patient: Joshua [name]  Procedure(s) Performed: RIGHT UNICOMPARTMENTAL KNEE (Right: Knee)  Patient Location: PACU  Anesthesia Type:MAC, Regional, and Spinal  Level of Consciousness: drowsy  Airway & Oxygen Therapy: Patient Spontanous Breathing  Post-op Assessment: Report given to RN and Post -op Vital signs reviewed and stable  Post vital signs: Reviewed and stable  Last Vitals:  Vitals Value Taken Time  BP 122/68 05/12/22 0939  Temp    Pulse 69 05/12/22 0942  Resp 11 05/12/22 0942  SpO2 95 % 05/12/22 0942  Vitals shown include unvalidated device data.  Last Pain:  Vitals:   05/12/22 0618  TempSrc:   PainSc: 0-No pain      Patients Stated Pain Goal: 0 (98/26/41 5830)  Complications: No notable events documented.

## 2022-05-12 NOTE — Anesthesia Postprocedure Evaluation (Signed)
Anesthesia Post Note  Patient: Joshua [name]  Procedure(s) Performed: RIGHT UNICOMPARTMENTAL KNEE (Right: Knee)     Patient location during evaluation: PACU Anesthesia Type: Regional, MAC and Spinal Level of consciousness: oriented and awake and alert Pain management: pain level controlled Vital Signs Assessment: post-procedure vital signs reviewed and stable Respiratory status: spontaneous breathing, respiratory function stable and patient connected to nasal cannula oxygen Cardiovascular status: blood pressure returned to baseline and stable Postop Assessment: no headache, no backache and no apparent nausea or vomiting Anesthetic complications: no   No notable events documented.  Last Vitals:  Vitals:   05/12/22 1230 05/12/22 1415  BP: (!) 145/97 (!) 157/103  Pulse: 61   Resp: 13   Temp:    SpO2: 98% 97%    Last Pain:  Vitals:   05/12/22 1115  TempSrc:   PainSc: 5                  Chavy Avera P Gary Bultman

## 2022-05-12 NOTE — Op Note (Signed)
Partial Knee Arthroplasty Procedure Note  Preoperative diagnosis: Right knee isolated medial osteoarthritis  Postoperative diagnosis:same  Operative findings: Complete loss of articular cartilage from medial compartment both femur and tibia Preserved articular cartilage in patellofemoral and lateral compartments  Operative procedure: Right unicompartmenal knee arthroplasty. CPT [phone]  Surgeon: N. Glee Arvin, MD  Assist: April Green, RNFA  Anesthesia: Spinal, regional, local  Tourniquet time: see anesthesia record  Implants used: Zimmer persona partial knee system Femur: 5 Tibia: G Polyethylene: 9 mm  Indication: Joshua [name] is a 51 y.o. year old male with a history of knee pain. Having failed conservative management, the patient elected to proceed with a partial knee arthroplasty.  We have reviewed the risk and benefits of the surgery and they elected to proceed after voicing understanding.  Procedure:  After informed consent was obtained and understanding of the risk were voiced including but not limited to bleeding, infection, damage to surrounding structures including nerves and vessels, blood clots, leg length inequality and the failure to achieve desired results, the operative extremity was marked with verbal confirmation of the patient in the holding area.   The patient was then brought to the operating room and transported to the operating room table in the supine position.  A tourniquet was applied to the operative extremity around the upper thigh. The operative limb was then prepped and draped in the usual sterile fashion and preoperative antibiotics were administered.  A time out was performed prior to the start of surgery confirming the correct extremity, preoperative antibiotic administration, as well as team members, implants and instruments available for the case. Correct surgical site was also confirmed with preoperative radiographs. The limb was then elevated for  exsanguination and the tourniquet was inflated.  A straight incision was made over the anterior aspect of the knee medial to the tibial tubercle to just above the patella.  Full-thickness flaps were elevated and a limited medial parapatellar arthrotomy was created sharply with a 10 blade.  Gross bleeders were cauterized.  The fat pad was removed for visualization.  A limited medial peel was performed.  The chondral surfaces were inspected and the medial compartment showed bare bone on both the femoral and the tibial side.  There was a small remnant of the medial meniscus remaining.  The cruciates, lateral compartment, patellofemoral compartments were all healthy.  Decision was made to proceed with the partial knee replacement.  The medial meniscal remnant was removed for visualization.  Retractors were placed and the patella was subluxed laterally.  The tibial cutting guide was then applied with the appropriate depth of resection and slope and alignment.  4 mm was resected off of the medial side.  The bony wafer was removed.  The knee was then placed into extension and the 8 mm spacer was then pinned to the medial femoral condyle.  The distal femoral cut was made through the guide.  The wafer of bone was removed.  The extension gap was checked.  The knee was then brought into flexion and we found that size 5 was appropriate.  The femur was then prepared through the guide.  We then prepared the tibia and found that G was appropriate and which offered excellent anterior to posterior and medial lateral coverage.  The tibia was prepared.  Trial components were then placed.  Range of motion was assessed as well as varus valgus stability and we found that a 9 mm poly was most appropriate.  In extension I could get 2  mm of opening with valgus stress and in flexion I could get 3 mm of opening with valgus stress.  The trial components were then removed.  The bony surfaces were thoroughly irrigated with pulsatile lavage.   Bony debris was removed.  Final components were cemented into place.  The final 9 mm poly was snapped into the tibial tray.  The tourniquet was then deflated and hemostasis was obtained.  Zynrelief and Vanc powder were placed in the joint.  Arthrotomy was closed with interrupted #1 Vicryl.  Subcutaneous tissue closed with interrupted 2.0 vicryl suture. The skin was then closed with a 3-0 Monocryl and Steri-Strips. A sterile dressing was applied.  The patient was awakened in the operating room and taken to recovery in stable condition. All sponge, needle, and instrument counts were correct at the end of the case.  Position: supine  Complications: none.  Time Out: performed   Drains/Packing: none  Estimated blood loss: minimal  Returned to Recovery Room: in good condition.   Antibiotics: yes   Mechanical VTE (DVT) Prophylaxis: sequential compression devices, TED thigh-high  Chemical VTE (DVT) Prophylaxis: Aspirin  Fluid Replacement  Crystalloid: see anesthesia record Blood: none  FFP: none   Specimens Removed: 1 to pathology   Sponge and Instrument Count Correct? yes   PACU: portable radiograph - knee AP and Lateral   Plan/RTC: Return in 2 weeks for wound check.   Weight Bearing/Load Lower Extremity: full   Implant Name Type Inv. Item Serial No. Manufacturer Lot No. LRB No. Used Action  CEMENT BONE REFOBACIN [id number] [place] - [id number] Cement CEMENT BONE REFOBACIN [id number] [place]  ZIMMER RECON(ORTH,TRAU,BIO,SG) [id number] Right 1 Implanted  BIOMET VIVACIT-E PARTIAL ARTICULAR SURGACE RIGHT MEDIAL SIZE G    BIOMET ORTHO AND TRAUMA [id number] Right 1 Implanted  KNEE PSN PK CMT TIB RM SZ G - [id number] Orthopedic Implant KNEE PSN PK CMT TIB RM SZ G  ZIMMER RECON(ORTH,TRAU,BIO,SG) [id number] Right 1 Implanted  KNEE PSN PK CMT FEM RM SZ 5 - [id number] Orthopedic Implant KNEE PSN PK CMT FEM RM SZ 5  ZIMMER RECON(ORTH,TRAU,BIO,SG) [id number] Right 1 Implanted    N. Glee Arvin, MD [facility] 9:12 AM

## 2022-05-12 NOTE — Anesthesia Procedure Notes (Signed)
Procedure Name: MAC Date/Time: 05/12/2022 7:15 AM  Performed by: Lorie Phenix, CRNAPre-anesthesia Checklist: Patient identified, Emergency Drugs available, Suction available and Patient being monitored Patient Re-evaluated:Patient Re-evaluated prior to induction Oxygen Delivery Method: Nasal cannula Preoxygenation: Pre-oxygenation with 100% oxygen Placement Confirmation: positive ETCO2

## 2022-05-13 ENCOUNTER — Encounter (HOSPITAL_COMMUNITY): Payer: Self-pay | Admitting: Orthopaedic Surgery

## 2022-05-14 ENCOUNTER — Telehealth: Payer: Self-pay | Admitting: Orthopaedic Surgery

## 2022-05-14 MED ORDER — KETOROLAC TROMETHAMINE 10 MG PO TABS: 10.0000 mg | ORAL_TABLET | Freq: Two times a day (BID) | ORAL | 0 refills | Status: DC | PRN

## 2022-05-14 NOTE — Telephone Encounter (Signed)
I sent toradol

## 2022-05-14 NOTE — Telephone Encounter (Signed)
Pt wife called in stating that the pt had surgery on Monday... Pt wife stated that pt is in pain... Pt wife stated that medication (oxyCODONE-acetaminophen (PERCOCET) 5-325 MG tablet) was prescribe to pt and its not working... Pt wife would like to know if there is something else the pt can take with the above medication... Pt wife requesting callback at [phone]

## 2022-05-17 ENCOUNTER — Telehealth: Payer: Self-pay | Admitting: Orthopedic Surgery

## 2022-05-17 MED ORDER — OXYCODONE HCL 5 MG PO TABS: 5.0000 mg | ORAL_TABLET | ORAL | 0 refills | Status: DC | PRN

## 2022-05-17 NOTE — Telephone Encounter (Signed)
Orthopedic Phone Call  Patient having a lot of pain after partial knee replacement. Has been taking tylenol, toradol, muscle relaxer, gabapentin, and is now out of percocet. He has been icing as well.   Since he is having acute post-operative pain, oxycodone was prescribed: 5-10mg  every 4 hours as needed for pain (50 tablets) were sent to [facility] on [address]. Told him to take 1000mg  tylenol TID. Continue to use the muscle relaxer and gabapentin. He should regularly ice the leg. He should also elevate the leg above the level of the heart as able. I explained that I am on call this weekend so if he has further issues, he should not hesitate to get in contact with me again.  , MD Orthopedic Surgeon

## 2022-05-21 ENCOUNTER — Telehealth: Payer: Self-pay | Admitting: Orthopaedic Surgery

## 2022-05-21 ENCOUNTER — Other Ambulatory Visit: Payer: Self-pay | Admitting: Physician Assistant

## 2022-05-21 MED ORDER — OXYCODONE HCL 5 MG PO TABS: 5.0000 mg | ORAL_TABLET | Freq: Four times a day (QID) | ORAL | 0 refills | Status: DC | PRN

## 2022-05-21 NOTE — Telephone Encounter (Signed)
I sent in refill but please let him iknow we need to wean on the frequency

## 2022-05-21 NOTE — Telephone Encounter (Signed)
Please advise 

## 2022-05-21 NOTE — Telephone Encounter (Signed)
Patient wanting a refill on his medication--Oxycodone please [phone]

## 2022-05-21 NOTE — Telephone Encounter (Signed)
I called patient and advised. 

## 2022-05-26 ENCOUNTER — Telehealth: Payer: Self-pay | Admitting: Orthopaedic Surgery

## 2022-05-26 NOTE — Telephone Encounter (Signed)
Refill on pain meds  

## 2022-05-27 ENCOUNTER — Ambulatory Visit (INDEPENDENT_AMBULATORY_CARE_PROVIDER_SITE_OTHER): Payer: 59 | Admitting: Physician Assistant

## 2022-05-27 ENCOUNTER — Other Ambulatory Visit: Payer: Self-pay | Admitting: Physician Assistant

## 2022-05-27 DIAGNOSIS — Z96651 Presence of right artificial knee joint: Secondary | ICD-10-CM

## 2022-05-27 MED ORDER — OXYCODONE-ACETAMINOPHEN 7.5-325 MG PO TABS: ORAL_TABLET | ORAL | 0 refills | Status: DC

## 2022-05-27 MED ORDER — OXYCODONE HCL 5 MG PO TABS: 5.0000 mg | ORAL_TABLET | Freq: Three times a day (TID) | ORAL | 0 refills | Status: DC | PRN

## 2022-05-27 MED ORDER — METHOCARBAMOL 750 MG PO TABS: 750.0000 mg | ORAL_TABLET | Freq: Three times a day (TID) | ORAL | 2 refills | Status: DC | PRN

## 2022-05-27 NOTE — Progress Notes (Signed)
Post-Op Visit Note   Patient: Joshua [name]           Date of Birth: [birth year]           MRN: [id number] Visit Date: 05/27/2022 PCP: Medicine, [facility]   Assessment & Plan:  Chief Complaint:  Chief Complaint  Patient presents with   Right Knee - Pain   Visit Diagnoses:  1. S/P right unicompartmental knee replacement     Plan: Patient is a pleasant 51 year old gentleman who comes in today 2 weeks status post right knee medial compartment joint replacement 05/12/2022.  He has been in a moderate amount of pain since surgery.  Toradol was initially added which did help.  He has since been taking oxycodone 10 mg and alternating Tylenol and ibuprofen every 2 hours which has not significantly helped.  He has been out of oxycodone for the past 3 days.  He has also been having trouble getting to sleep at night and has not had any relief from melatonin or Benadryl.  He has been compliant taking a baby aspirin twice daily for DVT prophylaxis.  Examination of his right knee reveals a well-healed surgical incision without evidence of infection or cellulitis.  Does have slight peri-incisional erythema.  Calf is soft and nontender.  He is neurovascular intact distally.  Today, Steri-Strips were applied.  Formal physical therapy referral has been made.  He will continue with aspirin twice daily for DVT prophylaxis.  I have increased his Percocet to 7.5 mg.  He will follow-up in 4 weeks for repeat evaluation and 2 view x-rays of the right knee.  Call with concerns or questions.  Follow-Up Instructions: Return in about 4 weeks (around 06/24/2022).   Orders:  No orders of the defined types were placed in this encounter.  No orders of the defined types were placed in this encounter.   Imaging: No new imaging PMFS History: Patient Active Problem List   Diagnosis Date Noted   Primary osteoarthritis of right knee 04/03/2022   Lumbar radiculopathy 03/04/2022   Urge incontinence of  urine 04/30/2021   Degenerative arthritis of knee, bilateral 03/15/2021   SI (sacroiliac) joint dysfunction 12/14/2020   Somatic dysfunction of spine, sacral 12/14/2020   Past Medical History:  Diagnosis Date   Alcoholism ([place])    Anxiety and depression    BPH with obstruction/lower urinary tract symptoms    Chronic back pain    sports med   Chronic pain of right knee    Chronic pain of right knee    multiple surgeries.  Baker's cyst aspirated/injected by Dr. Tamala Julian 2022-->good results   History of kidney stones    Hypogonadism in male    OAB (overactive bladder)    urol   Sleep apnea    Stenosing tenosynovitis    R thumb and L middle finger-->good response to inj by ortho 07/2020    No family history on file.  Past Surgical History:  Procedure Laterality Date   PARTIAL KNEE ARTHROPLASTY Right 05/12/2022   Procedure: RIGHT UNICOMPARTMENTAL KNEE;  Surgeon: Leandrew Koyanagi, MD;  Location: [facility];  Service: Orthopedics;  Laterality: Right;   righ knee surgery     5+.  Meniscectomy among others   Social History   Occupational History   Not on file  Tobacco Use   Smoking status: Never   Smokeless tobacco: Former  Scientific laboratory technician Use: Never used  Substance and Sexual Activity   Alcohol use: Yes  Alcohol/week: 3.0 standard drinks of alcohol    Types: 3 Cans of beer per week   Drug use: Not on file   Sexual activity: Not on file

## 2022-05-27 NOTE — Telephone Encounter (Signed)
Oxycodone  °

## 2022-05-27 NOTE — Telephone Encounter (Signed)
sent 

## 2022-06-04 ENCOUNTER — Other Ambulatory Visit: Payer: Self-pay | Admitting: Physician Assistant

## 2022-06-04 ENCOUNTER — Telehealth: Payer: Self-pay

## 2022-06-04 MED ORDER — OXYCODONE-ACETAMINOPHEN 7.5-325 MG PO TABS: ORAL_TABLET | ORAL | 0 refills | Status: DC

## 2022-06-04 NOTE — Telephone Encounter (Signed)
Patient would like a Rx refill of Oxycodone sent to his pharmacy.  CB# [phone].  Please advise.  Thank you

## 2022-06-04 NOTE — Telephone Encounter (Signed)
sent 

## 2022-06-19 NOTE — Progress Notes (Signed)
[facility] [place] Phone: [phone] Subjective:   Joshua [name], am serving as a scribe for Dr. Hulan Saas.  I'm seeing this patient by the request  of:  Medicine, [facility]  CC: Neck and back pain follow-up  [id number]  Joshua [name] is a 51 y.o. male coming in with complaint of back and neck pain. OMT 04/03/2022. S/P R unicompartmental R knee replacement. Patient states that his right lower back is tight and hurting from compensating since the knee pain. Patient is having a hard time relaxing.   Medications patient has been prescribed: none  Taking:         Reviewed prior external information including notes and imaging from previsou exam, outside providers and external EMR if available.   As well as notes that were available from care everywhere and other healthcare systems.  Past medical history, social, surgical and family history all reviewed in electronic medical record.  No pertanent information unless stated regarding to the chief complaint.   Past Medical History:  Diagnosis Date   Alcoholism ([place])    Anxiety and depression    BPH with obstruction/lower urinary tract symptoms    Chronic back pain    sports med   Chronic pain of right knee    Chronic pain of right knee    multiple surgeries.  Baker's cyst aspirated/injected by Dr. Tamala Julian 2022-->good results   History of kidney stones    Hypogonadism in male    OAB (overactive bladder)    urol   Sleep apnea    Stenosing tenosynovitis    R thumb and L middle finger-->good response to inj by ortho 07/2020    No Known Allergies   Review of Systems:  No headache, visual changes, nausea, vomiting, diarrhea, constipation, dizziness, abdominal pain, skin rash, fevers, chills, night sweats, weight loss, swollen lymph nodes, body aches, joint swelling, chest pain, shortness of breath, mood changes. POSITIVE muscle  aches  Objective  Blood pressure (!) 130/90, pulse 75, height 5\' 10"  (1.778 m), weight 200 lb (90.7 kg), SpO2 98 %.   General: No apparent distress alert and oriented x3 mood and affect normal, dressed appropriately.  HEENT: Pupils equal, extraocular movements intact  Respiratory: Patient's speak in full sentences and does not appear short of breath  Cardiovascular: No lower extremity edema, non tender, no erythema  Antalgic gait noted.  Patient is walking rather gingerly with the right knee.  Still lacking the last 2 degrees of extension and has 102 degrees of flexion.  Patient does have some swelling of the right knee still noted.  Low back exam significant loss of lordosis.  No worsening pain with flexion of the back no greater than 25 degrees with mild radicular symptoms in the L5 distribution on the right side  Osteopathic findings  C6 flexed rotated and side bent left T3 extended rotated and side bent right inhaled rib T7 extended rotated and side bent left L2 flexed rotated and side bent right L5 flexed rotated and side bent right Sacrum right on right       Assessment and Plan:  SI (sacroiliac) joint dysfunction Sacroiliac dysfunction with worsening symptoms secondary to him compensating for his surgery.  Patient does have also some radicular symptoms and known lumbar radiculopathy.  Patient could be a potential candidate for radiofrequency ablation with nerve entrapment noted at the L4 and L5 on the right side.  Will send patient for  a medial branch block to see if this will be beneficial.  Discussed which activities to do and which ones to avoid.  Increase activity slowly over the course of the next several weeks.  Will follow-up with me again in 6 to 8 weeks.    Nonallopathic problems  Decision today to treat with OMT was based on Physical Exam  After verbal consent patient was treated with HVLA, ME, FPR techniques in cervical, rib, thoracic, lumbar, and sacral   areas  Patient tolerated the procedure well with improvement in symptoms  Patient given exercises, stretches and lifestyle modifications  See medications in patient instructions if given  Patient will follow up in 4-8 weeks    The above documentation has been reviewed and is accurate and complete [name], DO          Note: This dictation was prepared with Dragon dictation along with smaller phrase technology. Any transcriptional errors that result from this process are unintentional.

## 2022-06-25 ENCOUNTER — Ambulatory Visit (INDEPENDENT_AMBULATORY_CARE_PROVIDER_SITE_OTHER): Payer: Self-pay

## 2022-06-25 ENCOUNTER — Ambulatory Visit (INDEPENDENT_AMBULATORY_CARE_PROVIDER_SITE_OTHER): Payer: 59 | Admitting: Orthopaedic Surgery

## 2022-06-25 ENCOUNTER — Ambulatory Visit (INDEPENDENT_AMBULATORY_CARE_PROVIDER_SITE_OTHER): Payer: 59 | Admitting: Family Medicine

## 2022-06-25 ENCOUNTER — Encounter: Payer: 59 | Admitting: Orthopaedic Surgery

## 2022-06-25 VITALS — BP 130/90 | HR 75 | Ht 70.0 in | Wt 200.0 lb

## 2022-06-25 DIAGNOSIS — Z96651 Presence of right artificial knee joint: Secondary | ICD-10-CM

## 2022-06-25 DIAGNOSIS — M533 Sacrococcygeal disorders, not elsewhere classified: Secondary | ICD-10-CM

## 2022-06-25 DIAGNOSIS — M9908 Segmental and somatic dysfunction of rib cage: Secondary | ICD-10-CM

## 2022-06-25 DIAGNOSIS — M9902 Segmental and somatic dysfunction of thoracic region: Secondary | ICD-10-CM

## 2022-06-25 DIAGNOSIS — M9903 Segmental and somatic dysfunction of lumbar region: Secondary | ICD-10-CM

## 2022-06-25 DIAGNOSIS — M9904 Segmental and somatic dysfunction of sacral region: Secondary | ICD-10-CM

## 2022-06-25 DIAGNOSIS — M9901 Segmental and somatic dysfunction of cervical region: Secondary | ICD-10-CM

## 2022-06-25 MED ORDER — MONTELUKAST SODIUM 10 MG PO TABS: 10.0000 mg | ORAL_TABLET | Freq: Every day | ORAL | 0 refills | Status: DC

## 2022-06-25 NOTE — Progress Notes (Signed)
   Post-Op Visit Note   Patient: Joshua [name] Joshua [name]           Date of Birth: 09-16-1970           MRN: [id number] Visit Date: 06/25/2022 PCP: Medicine, [facility]   Assessment & Plan:  Chief Complaint:  Chief Complaint  Patient presents with   Right Knee - Routine Post Op   Visit Diagnoses:  1. S/P right unicompartmental knee replacement     Plan: Joshua is 6 weeks status post right partial knee replacement medial compartment.  He is improving overall.  He is satisfied so far.  He feels improvement overall.  Examination of the right knee shows healed surgical scar.  He has moderate swelling with small joint effusion.  Range of motion is progressing well.  No signs of infection.  No calf tenderness.  The x-rays show stable partial knee replacement in good alignment.  Dental prophylaxis reinforced.  He will continue with outpatient PT.  Questions encouraged and answered.  Follow-up in 6 weeks for recheck.  Follow-Up Instructions: Return in about 6 weeks (around 08/06/2022).   Orders:  Orders Placed This Encounter  Procedures   XR Knee 1-2 Views Right   No orders of the defined types were placed in this encounter.   Imaging: XR Knee 1-2 Views Right  Result Date: 06/25/2022 Stable medial compartmental knee replacement in good alignment    PMFS History: Patient Active Problem List   Diagnosis Date Noted   Primary osteoarthritis of right knee 04/03/2022   Lumbar radiculopathy 03/04/2022   Urge incontinence of urine 04/30/2021   Degenerative arthritis of knee, bilateral 03/15/2021   SI (sacroiliac) joint dysfunction 12/14/2020   Somatic dysfunction of spine, sacral 12/14/2020   Past Medical History:  Diagnosis Date   Alcoholism ([place])    Anxiety and depression    BPH with obstruction/lower urinary tract symptoms    Chronic back pain    sports med   Chronic pain of right knee    Chronic pain of right knee    multiple surgeries.  Baker's cyst  aspirated/injected by Dr. Tamala Julian 2022-->good results   History of kidney stones    Hypogonadism in male    OAB (overactive bladder)    urol   Sleep apnea    Stenosing tenosynovitis    R thumb and L middle finger-->good response to inj by ortho 07/2020    No family history on file.  Past Surgical History:  Procedure Laterality Date   PARTIAL KNEE ARTHROPLASTY Right 05/12/2022   Procedure: RIGHT UNICOMPARTMENTAL KNEE;  Surgeon: Leandrew Koyanagi, MD;  Location: [place];  Service: Orthopedics;  Laterality: Right;   righ knee surgery     5+.  Meniscectomy among others   Social History   Occupational History   Not on file  Tobacco Use   Smoking status: Never   Smokeless tobacco: Former  Scientific laboratory technician Use: Never used  Substance and Sexual Activity   Alcohol use: Yes    Alcohol/week: 3.0 standard drinks of alcohol    Types: 3 Cans of beer per week   Drug use: Not on file   Sexual activity: Not on file

## 2022-06-25 NOTE — Assessment & Plan Note (Signed)
Sacroiliac dysfunction with worsening symptoms secondary to him compensating for his surgery.  Patient does have also some radicular symptoms and known lumbar radiculopathy.  Patient could be a potential candidate for radiofrequency ablation with nerve entrapment noted at the L4 and L5 on the right side.  Will send patient for a medial branch block to see if this will be beneficial.  Discussed which activities to do and which ones to avoid.  Increase activity slowly over the course of the next several weeks.  Will follow-up with me again in 6 to 8 weeks.

## 2022-06-25 NOTE — Patient Instructions (Addendum)
Good to see you  Singulair 10mg  take at night  Medial branch block ordered L4-L5 L5-S1 on the right  Call [phone] to schedule  Follow up in 6 weeks

## 2022-06-26 ENCOUNTER — Other Ambulatory Visit: Payer: Self-pay | Admitting: Family Medicine

## 2022-06-26 DIAGNOSIS — M9903 Segmental and somatic dysfunction of lumbar region: Secondary | ICD-10-CM

## 2022-07-23 ENCOUNTER — Other Ambulatory Visit: Payer: Self-pay | Admitting: Family Medicine

## 2022-07-23 DIAGNOSIS — M9903 Segmental and somatic dysfunction of lumbar region: Secondary | ICD-10-CM

## 2022-07-31 ENCOUNTER — Other Ambulatory Visit: Payer: Self-pay | Admitting: Family Medicine

## 2022-08-05 ENCOUNTER — Ambulatory Visit
Admission: RE | Admit: 2022-08-05 | Discharge: 2022-08-05 | Disposition: A | Discharge status: Home / Self Care | Payer: 59 | Source: Ambulatory Visit | Attending: Family Medicine | Admitting: Family Medicine

## 2022-08-05 ENCOUNTER — Other Ambulatory Visit: Payer: Self-pay | Admitting: Family Medicine

## 2022-08-05 DIAGNOSIS — M9903 Segmental and somatic dysfunction of lumbar region: Secondary | ICD-10-CM

## 2022-08-05 NOTE — Discharge Instructions (Signed)

## 2022-08-05 NOTE — Progress Notes (Unsigned)
[place] Phone: [phone] Subjective:   Fontaine No, am serving as a scribe for Dr. Hulan Saas.  I'm seeing this patient by the request  of:  Medicine, [facility]  CC: Back and neck pain follow-up  [id number]  Joshua [name] is a 52 y.o. male coming in with complaint of back and neck pain. OMT 06/25/2022. Patient states that he had MBB block yesterday. Was able to walk 5 miles.   Medications patient has been prescribed: None           Reviewed prior external information including notes and imaging from previsou exam, outside providers and external EMR if available.   As well as notes that were available from care everywhere and other healthcare systems.  Past medical history, social, surgical and family history all reviewed in electronic medical record.  No pertanent information unless stated regarding to the chief complaint.   Past Medical History:  Diagnosis Date   Alcoholism ([place])    Anxiety and depression    BPH with obstruction/lower urinary tract symptoms    Chronic back pain    sports med   Chronic pain of right knee    Chronic pain of right knee    multiple surgeries.  Baker's cyst aspirated/injected by Dr. Tamala Julian 2022-->good results   History of kidney stones    Hypogonadism in male    OAB (overactive bladder)    urol   Sleep apnea    Stenosing tenosynovitis    R thumb and L middle finger-->good response to inj by ortho 07/2020    No Known Allergies   Review of Systems:  No headache, visual changes, nausea, vomiting, diarrhea, constipation, dizziness, abdominal pain, skin rash, fevers, chills, night sweats, weight loss, swollen lymph nodes, body aches, joint swelling, chest pain, shortness of breath, mood changes. POSITIVE muscle aches  Objective  Blood pressure 124/84, pulse 94, height 5' 10"$  (1.778 m), weight 201 lb (91.2 kg), SpO2 97 %.   General: No  apparent distress alert and oriented x3 mood and affect normal, dressed appropriately.  HEENT: Pupils equal, extraocular movements intact  Respiratory: Patient's speak in full sentences and does not appear short of breath  Cardiovascular: No lower extremity edema, non tender, no erythema  Low back exam does still have significant tightness noted in the paraspinal musculature in the seems to be right greater than left.  Does have some tightness noted of the neck as well.  Likely negative straight leg test but still significant tightness with FABER test bilaterally.  Osteopathic findings  C2 flexed rotated and side bent right C6 flexed rotated and side bent left T3 extended rotated and side bent right inhaled rib T9 extended rotated and side bent left L2 flexed rotated and side bent right Sacrum right on right       Assessment and Plan:  SI (sacroiliac) joint dysfunction Patient still having some difficulty with increasing activity secondary to still getting over the partial knee replacement on the right side.  Patient is going to be going to a lifting area and traveling a significant amount as well.  Will still keep him from being in the regular activities at the moment.  Will see how patient responds well to the home exercises and icing regimen.  Follow-up with me again in 8 weeks    Nonallopathic problems  Decision today to treat with OMT was based on Physical Exam  After verbal consent  patient was treated with HVLA, ME, FPR techniques in cervical, rib, thoracic, lumbar, and sacral  areas  Patient tolerated the procedure well with improvement in symptoms  Patient given exercises, stretches and lifestyle modifications  See medications in patient instructions if given  Patient will follow up in 4-8 weeks     The above documentation has been reviewed and is accurate and complete Lyndal Pulley, DO         Note: This dictation was prepared with Dragon dictation along  with smaller phrase technology. Any transcriptional errors that result from this process are unintentional.

## 2022-08-06 ENCOUNTER — Ambulatory Visit: Payer: 59 | Admitting: Physician Assistant

## 2022-08-06 ENCOUNTER — Ambulatory Visit (INDEPENDENT_AMBULATORY_CARE_PROVIDER_SITE_OTHER): Payer: 59 | Admitting: Family Medicine

## 2022-08-06 ENCOUNTER — Other Ambulatory Visit: Payer: Self-pay | Admitting: Physician Assistant

## 2022-08-06 VITALS — BP 124/84 | HR 94 | Ht 70.0 in | Wt 201.0 lb

## 2022-08-06 DIAGNOSIS — M9901 Segmental and somatic dysfunction of cervical region: Secondary | ICD-10-CM | POA: Diagnosis not present

## 2022-08-06 DIAGNOSIS — M533 Sacrococcygeal disorders, not elsewhere classified: Secondary | ICD-10-CM

## 2022-08-06 DIAGNOSIS — M9908 Segmental and somatic dysfunction of rib cage: Secondary | ICD-10-CM

## 2022-08-06 DIAGNOSIS — M9903 Segmental and somatic dysfunction of lumbar region: Secondary | ICD-10-CM | POA: Diagnosis not present

## 2022-08-06 DIAGNOSIS — M9904 Segmental and somatic dysfunction of sacral region: Secondary | ICD-10-CM | POA: Diagnosis not present

## 2022-08-06 DIAGNOSIS — M9902 Segmental and somatic dysfunction of thoracic region: Secondary | ICD-10-CM

## 2022-08-06 DIAGNOSIS — Z96651 Presence of right artificial knee joint: Secondary | ICD-10-CM

## 2022-08-06 MED ORDER — AMOXICILLIN 500 MG PO CAPS: ORAL_CAPSULE | ORAL | 2 refills | Status: DC

## 2022-08-06 NOTE — Progress Notes (Signed)
Post-Op Visit Note   Patient: Joshua [name]           Date of Birth: [birth year]           MRN: [id number] Visit Date: 08/06/2022 PCP: Medicine, [facility]   Assessment & Plan:  Chief Complaint:  Chief Complaint  Patient presents with   Right Knee - Routine Post Op   Visit Diagnoses:  1. S/P right unicompartmental knee replacement     Plan: Patient is a pleasant 52 year old gentleman who comes in today approximately 3 months status post right unicompartmental knee replacement 05/12/2022.  He has been doing relatively well.  He has been in physical therapy as well as working out on his own.  Examination of his right knee reveals a fully healed surgical scar without complication.  Range of motion 0 to approximately 120 degrees.  He is stable to valgus and varus stress.  He is neurovascular intact distally.  At this point, He may discharge from physical therapy when ready, but will continue to work on a home exercise program for range of motion and strengthening.  I have sent in amoxicillin to his pharmacy for an upcoming dental appointment.  He will follow-up with [place] in 3 months for repeat evaluation and 2 view x-rays of the right knee.    Follow-Up Instructions: Return in about 3 months (around 11/04/2022).   Orders:  No orders of the defined types were placed in this encounter.  No orders of the defined types were placed in this encounter.   Imaging: DG FACET JT INJ L /S 2ND LEVEL RIGHT W/FL/CT  Addendum Date: 08/05/2022   ADDENDUM REPORT: 08/05/2022 16:20 ADDENDUM: I called the patient at 4:30 p.m., approximately 7 hours after the procedure. He has experienced approximately 80% relief in his facet mediated pain since the procedure. He is therefore a good candidate for radiofrequency ablation and will be scheduled at his earliest convenience. Electronically Signed   By: Titus Dubin M.D.   On: 08/05/2022 16:20   Result Date: 08/05/2022 CLINICAL DATA:  Chronic  right-sided low back pain with stiffness and difficulty walking or standing for long periods of time. Bilateral L4-L5 greater than L5-S1 facet arthropathy on MRI with inflammatory changes at L4-L5. Right L4-L5 and L5-S1 facet medial branch blocks requested. EXAM: FLUOROSCOPICALLY GUIDED RIGHT L3 AND L4 MEDIAL BRANCH BLOCKS AND RIGHT L5 DORSAL RAMUS BLOCK, IN PREPARATION FOR RIGHT L4-L5 AND L5-S1 FACET RADIOFREQUENCY ABLATION FLUOROSCOPY TIME:  Radiation Exposure Index (as provided by the fluoroscopic device): 4.2 mGy Kerma TECHNIQUE: The procedure, risks, benefits, and alternatives were explained to the patient. Questions regarding the procedure were encouraged and answered. The patient understands and consents to the procedure. An appropriate skin entry site was determined fluoroscopically and marked. Site prepped with betadine, draped in usual sterile fashion, and infiltrated locally with 1% lidocaine. RIGHT L3 MEDIAL BRANCH BLOCK: A posterior oblique approach was taken to the junction of the superior articular process and transverse process on the right at L4 using a 3.5 inch 22 gauge spinal needle, to lie along the course of the right L3 medial branch nerve. 0.5 mL of 0.5% bupivacaine were injected. RIGHT L4 MEDIAL BRANCH BLOCK: A posterior oblique approach was taken to the junction of the superior articular process and transverse process on the right at L5 using a 3.5 inch 22 gauge spinal needle, to lie along the course of the right L4 medial branch nerve. 0.5 mL of 0.5% bupivacaine were injected. RIGHT L5  DORSAL RAMUS BLOCK: A posterior oblique approach was taken to the junction of the S1 superior articular process and sacral ala on the right using a 3.5 inch 22 gauge spinal needle, to lie along the course of the right L5 dorsal ramus. 0.5 mL of 0.5% bupivacaine were injected. The procedure was well-tolerated. IMPRESSION: 1. Technically successful medial branch/dorsal ramus blocks in preparation for right  L4-L5 and L5-S1 facet radiofrequency ablation. I will call the patient later today to assess for durable and sustained pain relief. An addendum will be placed at that time. Electronically Signed: By: Titus Dubin M.D. On: 08/05/2022 10:12   DG FACET JT INJ L /S SINGLE LEVEL RIGHT W/FL/CT  Addendum Date: 08/05/2022   ADDENDUM REPORT: 08/05/2022 16:20 ADDENDUM: I called the patient at 4:30 p.m., approximately 7 hours after the procedure. He has experienced approximately 80% relief in his facet mediated pain since the procedure. He is therefore a good candidate for radiofrequency ablation and will be scheduled at his earliest convenience. Electronically Signed   By: Titus Dubin M.D.   On: 08/05/2022 16:20   Result Date: 08/05/2022 CLINICAL DATA:  Chronic right-sided low back pain with stiffness and difficulty walking or standing for long periods of time. Bilateral L4-L5 greater than L5-S1 facet arthropathy on MRI with inflammatory changes at L4-L5. Right L4-L5 and L5-S1 facet medial branch blocks requested. EXAM: FLUOROSCOPICALLY GUIDED RIGHT L3 AND L4 MEDIAL BRANCH BLOCKS AND RIGHT L5 DORSAL RAMUS BLOCK, IN PREPARATION FOR RIGHT L4-L5 AND L5-S1 FACET RADIOFREQUENCY ABLATION FLUOROSCOPY TIME:  Radiation Exposure Index (as provided by the fluoroscopic device): 4.2 mGy Kerma TECHNIQUE: The procedure, risks, benefits, and alternatives were explained to the patient. Questions regarding the procedure were encouraged and answered. The patient understands and consents to the procedure. An appropriate skin entry site was determined fluoroscopically and marked. Site prepped with betadine, draped in usual sterile fashion, and infiltrated locally with 1% lidocaine. RIGHT L3 MEDIAL BRANCH BLOCK: A posterior oblique approach was taken to the junction of the superior articular process and transverse process on the right at L4 using a 3.5 inch 22 gauge spinal needle, to lie along the course of the right L3 medial branch  nerve. 0.5 mL of 0.5% bupivacaine were injected. RIGHT L4 MEDIAL BRANCH BLOCK: A posterior oblique approach was taken to the junction of the superior articular process and transverse process on the right at L5 using a 3.5 inch 22 gauge spinal needle, to lie along the course of the right L4 medial branch nerve. 0.5 mL of 0.5% bupivacaine were injected. RIGHT L5 DORSAL RAMUS BLOCK: A posterior oblique approach was taken to the junction of the S1 superior articular process and sacral ala on the right using a 3.5 inch 22 gauge spinal needle, to lie along the course of the right L5 dorsal ramus. 0.5 mL of 0.5% bupivacaine were injected. The procedure was well-tolerated. IMPRESSION: 1. Technically successful medial branch/dorsal ramus blocks in preparation for right L4-L5 and L5-S1 facet radiofrequency ablation. I will call the patient later today to assess for durable and sustained pain relief. An addendum will be placed at that time. Electronically Signed: By: Titus Dubin M.D. On: 08/05/2022 10:12   DG FACET JT INJ L /S 2ND LEVEL RIGHT W/FL/CT  Addendum Date: 08/05/2022   ADDENDUM REPORT: 08/05/2022 16:20 ADDENDUM: I called the patient at 4:30 p.m., approximately 7 hours after the procedure. He has experienced approximately 80% relief in his facet mediated pain since the procedure. He is therefore a good  candidate for radiofrequency ablation and will be scheduled at his earliest convenience. Electronically Signed   By: Titus Dubin M.D.   On: 08/05/2022 16:20   Result Date: 08/05/2022 CLINICAL DATA:  Chronic right-sided low back pain with stiffness and difficulty walking or standing for long periods of time. Bilateral L4-L5 greater than L5-S1 facet arthropathy on MRI with inflammatory changes at L4-L5. Right L4-L5 and L5-S1 facet medial branch blocks requested. EXAM: FLUOROSCOPICALLY GUIDED RIGHT L3 AND L4 MEDIAL BRANCH BLOCKS AND RIGHT L5 DORSAL RAMUS BLOCK, IN PREPARATION FOR RIGHT L4-L5 AND L5-S1 FACET  RADIOFREQUENCY ABLATION FLUOROSCOPY TIME:  Radiation Exposure Index (as provided by the fluoroscopic device): 4.2 mGy Kerma TECHNIQUE: The procedure, risks, benefits, and alternatives were explained to the patient. Questions regarding the procedure were encouraged and answered. The patient understands and consents to the procedure. An appropriate skin entry site was determined fluoroscopically and marked. Site prepped with betadine, draped in usual sterile fashion, and infiltrated locally with 1% lidocaine. RIGHT L3 MEDIAL BRANCH BLOCK: A posterior oblique approach was taken to the junction of the superior articular process and transverse process on the right at L4 using a 3.5 inch 22 gauge spinal needle, to lie along the course of the right L3 medial branch nerve. 0.5 mL of 0.5% bupivacaine were injected. RIGHT L4 MEDIAL BRANCH BLOCK: A posterior oblique approach was taken to the junction of the superior articular process and transverse process on the right at L5 using a 3.5 inch 22 gauge spinal needle, to lie along the course of the right L4 medial branch nerve. 0.5 mL of 0.5% bupivacaine were injected. RIGHT L5 DORSAL RAMUS BLOCK: A posterior oblique approach was taken to the junction of the S1 superior articular process and sacral ala on the right using a 3.5 inch 22 gauge spinal needle, to lie along the course of the right L5 dorsal ramus. 0.5 mL of 0.5% bupivacaine were injected. The procedure was well-tolerated. IMPRESSION: 1. Technically successful medial branch/dorsal ramus blocks in preparation for right L4-L5 and L5-S1 facet radiofrequency ablation. I will call the patient later today to assess for durable and sustained pain relief. An addendum will be placed at that time. Electronically Signed: By: Titus Dubin M.D. On: 08/05/2022 10:12    PMFS History: Patient Active Problem List   Diagnosis Date Noted   Primary osteoarthritis of right knee 04/03/2022   Lumbar radiculopathy 03/04/2022   Urge  incontinence of urine 04/30/2021   Degenerative arthritis of knee, bilateral 03/15/2021   SI (sacroiliac) joint dysfunction 12/14/2020   Somatic dysfunction of spine, sacral 12/14/2020   Past Medical History:  Diagnosis Date   Alcoholism ([place])    Anxiety and depression    BPH with obstruction/lower urinary tract symptoms    Chronic back pain    sports med   Chronic pain of right knee    Chronic pain of right knee    multiple surgeries.  Baker's cyst aspirated/injected by Dr. Tamala Julian 2022-->good results   History of kidney stones    Hypogonadism in male    OAB (overactive bladder)    urol   Sleep apnea    Stenosing tenosynovitis    R thumb and L middle finger-->good response to inj by ortho 07/2020    No family history on file.  Past Surgical History:  Procedure Laterality Date   PARTIAL KNEE ARTHROPLASTY Right 05/12/2022   Procedure: RIGHT UNICOMPARTMENTAL KNEE;  Surgeon: Leandrew Koyanagi, MD;  Location: [place];  Service: Orthopedics;  Laterality: Right;  righ knee surgery     5+.  Meniscectomy among others   Social History   Occupational History   Not on file  Tobacco Use   Smoking status: Never   Smokeless tobacco: Former  Scientific laboratory technician Use: Never used  Substance and Sexual Activity   Alcohol use: Yes    Alcohol/week: 3.0 standard drinks of alcohol    Types: 3 Cans of beer per week   Drug use: Not on file   Sexual activity: Not on file

## 2022-08-06 NOTE — Assessment & Plan Note (Signed)
Patient still having some difficulty with increasing activity secondary to still getting over the partial knee replacement on the right side.  Patient is going to be going to a lifting area and traveling a significant amount as well.  Will still keep him from being in the regular activities at the moment.  Will see how patient responds well to the home exercises and icing regimen.  Follow-up with me again in 8 weeks

## 2022-08-13 ENCOUNTER — Ambulatory Visit
Admission: RE | Admit: 2022-08-13 | Discharge: 2022-08-13 | Disposition: A | Discharge status: Home / Self Care | Payer: 59 | Source: Ambulatory Visit | Attending: Family Medicine | Admitting: Family Medicine

## 2022-08-13 ENCOUNTER — Other Ambulatory Visit: Payer: Self-pay | Admitting: Family Medicine

## 2022-08-13 DIAGNOSIS — M9903 Segmental and somatic dysfunction of lumbar region: Secondary | ICD-10-CM

## 2022-08-13 MED ORDER — KETOROLAC TROMETHAMINE 30 MG/ML IJ SOLN
30.0000 mg | Freq: Once | INTRAMUSCULAR | Status: AC
  Administered 2022-08-13: 30 mg via INTRAVENOUS

## 2022-08-13 MED ORDER — MIDAZOLAM HCL 2 MG/2ML IJ SOLN
1.0000 mg | INTRAMUSCULAR | Status: DC | PRN
  Administered 2022-08-13: 1 mg via INTRAVENOUS
  Administered 2022-08-13: 0.5 mg via INTRAVENOUS
  Administered 2022-08-13: 1 mg via INTRAVENOUS
  Administered 2022-08-13: 0.5 mg via INTRAVENOUS

## 2022-08-13 MED ORDER — SODIUM CHLORIDE 0.9 % IV SOLN: INTRAVENOUS | Status: DC

## 2022-08-13 MED ORDER — METHYLPREDNISOLONE ACETATE 40 MG/ML INJ SUSP (RADIOLOG
80.0000 mg | Freq: Once | INTRAMUSCULAR | Status: AC
  Administered 2022-08-13: 80 mg via INTRALESIONAL

## 2022-08-13 MED ORDER — FENTANYL CITRATE PF 50 MCG/ML IJ SOSY
25.0000 ug | PREFILLED_SYRINGE | INTRAMUSCULAR | Status: DC | PRN
  Administered 2022-08-13 (×2): 25 ug via INTRAVENOUS

## 2022-08-13 NOTE — Discharge Instructions (Signed)
Radio Frequency Ablation Post Procedure Discharge Instructions ? ?May resume a regular diet and any medications that you routinely take (including pain medications). ?No driving day of procedure. ?Upon discharge go home and rest for at least 4 hours.  May use an ice pack as needed to injection sites on back. ?Remove bandades later, today. ? ? ? ?Please contact our office at [phone] for the following symptoms: ? ?Fever greater than 100 degrees ?Increased swelling, pain, or redness at injection site. ? ? ?Thank you for visiting [facility].  ?

## 2022-08-13 NOTE — Progress Notes (Signed)
Pt back in nursing recovery area. Pt still drowsy from procedure but will wake up when spoken to. Pt follows commands, talks in complete sentences and has no complaints at this time. Pt will be monitored until discharged by Radiologist.   

## 2022-09-16 NOTE — Progress Notes (Signed)
Joshua Joshua [name] Phone: [phone] Subjective:   Joshua Joshua [name], am serving as a scribe for Dr. Hulan Saas.  I'm seeing this patient by the request  of:  Medicine, [facility]  CC: Back and neck as well as right knee pain  [id number]  Joshua Joshua [name] is a 52 y.o. male coming in with complaint of back and neck pain. OMT 08/06/2022. Patient states that he has RFA since last visit. Pain is worse. Pain is worse on R side but he is feeling it throughout lumbar spine. Is able to be active but has increase in pain.   Medications patient has been prescribed: Singulair  Taking:         Reviewed prior external information including notes and imaging from previsou exam, outside providers and external EMR if available.   As well as notes that were available from care everywhere and other healthcare systems.  Past medical history, social, surgical and family history all reviewed in electronic medical record.  Joshua [name] pertanent information unless stated regarding to the chief complaint.   Past Medical History:  Diagnosis Date   Alcoholism ([place])    Anxiety and depression    BPH with obstruction/lower urinary tract symptoms    Chronic back pain    sports med   Chronic pain of right knee    Chronic pain of right knee    multiple surgeries.  Baker's cyst aspirated/injected by Dr. Tamala Julian 2022-->good results   History of kidney stones    Hypogonadism in male    OAB (overactive bladder)    urol   Sleep apnea    Stenosing tenosynovitis    R thumb and L middle finger-->good response to inj by ortho 07/2020    Joshua [name] Known Allergies   Review of Systems:  Joshua [name] headache, visual changes, nausea, vomiting, diarrhea, constipation, dizziness, abdominal pain, skin rash, fevers, chills, night sweats, weight loss, swollen lymph nodes, body aches, joint swelling, chest pain, shortness of breath, mood changes. POSITIVE muscle  aches  Objective  Blood pressure 112/82, pulse 82, height 5\' 10"  (1.778 m), weight 200 lb (90.7 kg), SpO2 93 %.   General: Joshua [name] apparent distress alert and oriented x3 mood and affect normal, dressed appropriately.  HEENT: Pupils equal, extraocular movements intact  Respiratory: Patient's speak in full sentences and does not appear short of breath  Cardiovascular: Joshua [name] lower extremity edema, non tender, Joshua [name] erythema  Back exam has significantly less extension than usual.  Significant tightness noted.  Worsening pain on the right side of the lower back.  Antalgic gait favoring the right knee which is the replacement.  Lacks last 2 degrees of extension  Osteopathic findings  C3 flexed rotated and side bent right C6 flexed rotated and side bent left T3 extended rotated and side bent right inhaled rib T8 extended rotated and side bent left L2 flexed rotated and side bent right L5 flexed rotated and side bent left Sacrum right on right       Assessment and Plan:  Lumbar radiculopathy Patient did not respond well to the facet injections for the facet arthropathy.  Patient on MRI previously did have a cyst formation noted that could be potentially contributing with some of the impingement noted.  Discussed with patient at this point he has failed all conservative therapy including formal physical therapy, icing regimen, home exercises as well as radiofrequency ablation for the facet arthropathy. At this point I will refer  the patient to neurosurgery to discuss potential surgical intervention.  Patient is only 52 years old and would like to be significantly more active than he is at this time.    Nonallopathic problems  Decision today to treat with OMT was based on Physical Exam  After verbal consent patient was treated with HVLA, ME, FPR techniques in cervical, rib, thoracic, lumbar, and sacral  areas  Patient tolerated the procedure well with improvement in symptoms  Patient given  exercises, stretches and lifestyle modifications  See medications in patient instructions if given  Patient will follow up in 4-8 weeks      The above documentation has been reviewed and is accurate and complete Joshua Pulley, DO        Note: This dictation was prepared with Dragon dictation along with smaller phrase technology. Any transcriptional errors that result from this process are unintentional.

## 2022-09-24 ENCOUNTER — Ambulatory Visit (INDEPENDENT_AMBULATORY_CARE_PROVIDER_SITE_OTHER): Payer: 59 | Admitting: Family Medicine

## 2022-09-24 ENCOUNTER — Encounter: Payer: Self-pay | Admitting: Family Medicine

## 2022-09-24 VITALS — BP 112/82 | HR 82 | Ht 70.0 in | Wt 200.0 lb

## 2022-09-24 DIAGNOSIS — M5416 Radiculopathy, lumbar region: Secondary | ICD-10-CM | POA: Diagnosis not present

## 2022-09-24 DIAGNOSIS — M9904 Segmental and somatic dysfunction of sacral region: Secondary | ICD-10-CM | POA: Diagnosis not present

## 2022-09-24 DIAGNOSIS — M9902 Segmental and somatic dysfunction of thoracic region: Secondary | ICD-10-CM

## 2022-09-24 DIAGNOSIS — M9908 Segmental and somatic dysfunction of rib cage: Secondary | ICD-10-CM | POA: Diagnosis not present

## 2022-09-24 DIAGNOSIS — M9903 Segmental and somatic dysfunction of lumbar region: Secondary | ICD-10-CM | POA: Diagnosis not present

## 2022-09-24 DIAGNOSIS — M9901 Segmental and somatic dysfunction of cervical region: Secondary | ICD-10-CM | POA: Diagnosis not present

## 2022-09-24 NOTE — Assessment & Plan Note (Addendum)
Patient did not respond well to the facet injections for the facet arthropathy.  Patient on MRI previously did have a cyst formation noted that could be potentially contributing with some of the impingement noted.  Discussed with patient at this point he has failed all conservative therapy including formal physical therapy, icing regimen, home exercises as well as radiofrequency ablation for the facet arthropathy. At this point I will refer the patient to neurosurgery to discuss potential surgical intervention.  Patient is only 52 years old and would like to be significantly more active than he is at this time.

## 2022-09-24 NOTE — Patient Instructions (Addendum)
Referral neurosurgery Dr. Reatha Armour  [facility] See you again in 2 months

## 2022-10-30 ENCOUNTER — Other Ambulatory Visit: Payer: Self-pay | Admitting: Surgery

## 2022-10-30 DIAGNOSIS — M4716 Other spondylosis with myelopathy, lumbar region: Secondary | ICD-10-CM

## 2022-11-04 NOTE — Progress Notes (Unsigned)
[name] [address] [place] [address] Phone: [phone] Subjective:   Joshua [name], am serving as a scribe for Dr. Antoine Primas.  I'm seeing this patient by the request  of:  Medicine, [facility]  CC: Back and neck pain follow-up.  ION:[id number]  [place] K Sarwar is a 52 y.o. male coming in with complaint of back and neck pain. OMT 09/24/2022. Patient states that his lower back pain is worse than last visit. Pain is constant.  Continues to have discomfort and pain that seems to be worsening.  Medications patient has been prescribed: Singulair  Taking:         Reviewed prior external information including notes and imaging from previsou exam, outside providers and external EMR if available.   As well as notes that were available from care everywhere and other healthcare systems.  Past medical history, social, surgical and family history all reviewed in electronic medical record.  No pertanent information unless stated regarding to the chief complaint.   Past Medical History:  Diagnosis Date   Alcoholism ([facility])    Anxiety and depression    BPH with obstruction/lower urinary tract symptoms    Chronic back pain    sports med   Chronic pain of right knee    Chronic pain of right knee    multiple surgeries.  Baker's cyst aspirated/injected by Dr. Katrinka Blazing 2022-->good results   History of kidney stones    Hypogonadism in male    OAB (overactive bladder)    urol   Sleep apnea    Stenosing tenosynovitis    R thumb and L middle finger-->good response to inj by ortho 07/2020    No Known Allergies   Review of Systems:  No headache, visual changes, nausea, vomiting, diarrhea, constipation, dizziness, abdominal pain, skin rash, fevers, chills, night sweats, weight loss, swollen lymph nodes, body aches, joint swelling, chest pain, shortness of breath, mood changes. POSITIVE muscle aches  Objective  Blood pressure  110/78, pulse 73, height 5\' 10"  (1.778 m), weight 198 lb (89.8 kg), SpO2 98 %.   General: No apparent distress alert and oriented x3 mood and affect normal, dressed appropriately.  HEENT: Pupils equal, extraocular movements intact  Respiratory: Patient's speak in full sentences and does not appear short of breath  Cardiovascular: No lower extremity edema, non tender, no erythema  Low back exam does have some loss lordosis noted.  Does have worsening pain with extension of the back.  Patient does have tightness noted with FABER test bilaterally.  Osteopathic findings  C2 flexed rotated and side bent right C7 flexed rotated and side bent left T3 extended rotated and side bent right inhaled rib T8 extended rotated and side bent left L4 flexed rotated and side bent left  Sacrum right on right       Assessment and Plan:  Lumbar radiculopathy Reviewed patient's MRI again.  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  It was potentially pushing on the L4 nerve root.  Encourage patient to follow-up with neurosurgery again to discuss the possibility of possible surgical intervention with patient having chronic pain and worsening discomfort.  We discussed different medications again.  We discussed potential need for prednisone.  Patient is still taking over-the-counter medications regularly.  Will follow-up with me again in 6 to 8 weeks otherwise. Medications include meloxicam and gabapentin that we have prescribed to him previously  Nonallopathic problems  Decision today to treat with OMT was based on  Physical Exam  After verbal consent patient was treated with HVLA, ME, FPR techniques in cervical, rib, thoracic, lumbar, and sacral  areas  Patient tolerated the procedure well with improvement in symptoms  Patient given exercises, stretches and lifestyle modifications  See medications in patient instructions if given  Patient will follow up in 4-8 weeks    The above documentation has been  reviewed and is accurate and complete [name], DO          Note: This dictation was prepared with Dragon dictation along with smaller phrase technology. Any transcriptional errors that result from this process are unintentional.

## 2022-11-05 ENCOUNTER — Encounter: Payer: Self-pay | Admitting: Family Medicine

## 2022-11-05 ENCOUNTER — Ambulatory Visit (INDEPENDENT_AMBULATORY_CARE_PROVIDER_SITE_OTHER): Payer: 59 | Admitting: Family Medicine

## 2022-11-05 VITALS — BP 110/78 | HR 73 | Ht 70.0 in | Wt 198.0 lb

## 2022-11-05 DIAGNOSIS — M9903 Segmental and somatic dysfunction of lumbar region: Secondary | ICD-10-CM

## 2022-11-05 DIAGNOSIS — M9904 Segmental and somatic dysfunction of sacral region: Secondary | ICD-10-CM | POA: Diagnosis not present

## 2022-11-05 DIAGNOSIS — M5416 Radiculopathy, lumbar region: Secondary | ICD-10-CM | POA: Diagnosis not present

## 2022-11-05 DIAGNOSIS — M9901 Segmental and somatic dysfunction of cervical region: Secondary | ICD-10-CM

## 2022-11-05 DIAGNOSIS — M9908 Segmental and somatic dysfunction of rib cage: Secondary | ICD-10-CM

## 2022-11-05 DIAGNOSIS — M9902 Segmental and somatic dysfunction of thoracic region: Secondary | ICD-10-CM

## 2022-11-05 NOTE — Assessment & Plan Note (Signed)
Reviewed patient's MRI again.  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  It was potentially pushing on the L4 nerve root.  Encourage patient to follow-up with neurosurgery again to discuss the possibility of possible surgical intervention with patient having chronic pain and worsening discomfort.  We discussed different medications again.  We discussed potential need for prednisone.  Patient is still taking over-the-counter medications regularly.  Will follow-up with me again in 6 to 8 weeks otherwise.

## 2022-11-05 NOTE — Patient Instructions (Signed)
Good to see you! We'll order another epidural  Call [phone]  Check with neuro see if they can remove cyst Good luck with other procedure today See you again in 7-8 weeks

## 2022-12-02 ENCOUNTER — Ambulatory Visit
Admission: RE | Admit: 2022-12-02 | Discharge: 2022-12-02 | Disposition: A | Discharge status: Home / Self Care | Payer: 59 | Source: Ambulatory Visit | Attending: Family Medicine | Admitting: Family Medicine

## 2022-12-02 DIAGNOSIS — M5416 Radiculopathy, lumbar region: Secondary | ICD-10-CM

## 2022-12-02 MED ORDER — IOPAMIDOL (ISOVUE-M 200) INJECTION 41%
1.0000 mL | Freq: Once | INTRAMUSCULAR | Status: AC
  Administered 2022-12-02: 1 mL via EPIDURAL

## 2022-12-02 MED ORDER — METHYLPREDNISOLONE ACETATE 40 MG/ML INJ SUSP (RADIOLOG
80.0000 mg | Freq: Once | INTRAMUSCULAR | Status: AC
  Administered 2022-12-02: 80 mg via EPIDURAL

## 2022-12-02 NOTE — Discharge Instructions (Signed)

## 2022-12-23 NOTE — Progress Notes (Unsigned)
[name] [address] [place] [address] Phone: [phone] Subjective:   Joshua [name], am serving as a scribe for Dr. Antoine Primas.  I'm seeing this patient by the request  of:  Medicine, [facility]  CC: Back and neck pain follow-up  [id number]  Joshua [name] is a 52 y.o. male coming in with complaint of back and neck pain. OMT 11/05/2022.  Last epidural was June 11.  Patient states doing well. Wants to talk knee health, how his epidural went, and cyst. No new concerns.  Medications patient has been prescribed: Singulair  Taking:         Reviewed prior external information including notes and imaging from previsou exam, outside providers and external EMR if available.   As well as notes that were available from care everywhere and other healthcare systems.  Past medical history, social, surgical and family history all reviewed in electronic medical record.  No pertanent information unless stated regarding to the chief complaint.   Past Medical History:  Diagnosis Date   Alcoholism (HCC)    Anxiety and depression    BPH with obstruction/lower urinary tract symptoms    Chronic back pain    sports med   Chronic pain of right knee    Chronic pain of right knee    multiple surgeries.  Baker's cyst aspirated/injected by Dr. Katrinka Blazing 2022-->good results   History of kidney stones    Hypogonadism in male    OAB (overactive bladder)    urol   Sleep apnea    Stenosing tenosynovitis    R thumb and L middle finger-->good response to inj by ortho 07/2020    No Known Allergies   Review of Systems:  No headache, visual changes, nausea, vomiting, diarrhea, constipation, dizziness, abdominal pain, skin rash, fevers, chills, night sweats, weight loss, swollen lymph nodes, body aches, joint swelling, chest pain, shortness of breath, mood changes. POSITIVE muscle aches  Objective  Blood pressure (!) 140/72, pulse 79,  height 5\' 10"  (1.778 m), weight 204 lb (92.5 kg), SpO2 95 %.   General: No apparent distress alert and oriented x3 mood and affect normal, dressed appropriately.  HEENT: Pupils equal, extraocular movements intact  Respiratory: Patient's speak in full sentences and does not appear short of breath  Cardiovascular: No lower extremity edema, non tender, no erythema  MSK:  Back does have some loss lordosis.  Still has a positive straight leg test noted.  This is on the right side.  Increasing stiffness noted of the hamstring compared to the contralateral side.  Osteopathic findings  C2 flexed rotated and side bent right C6 flexed rotated and side bent left T3 extended rotated and side bent right inhaled rib T9 extended rotated and side bent left L2 flexed rotated and side bent right L4 flexed rotated and side bent left Sacrum right on right    Assessment and Plan:  Lumbar radiculopathy Patient is responding somewhat to the epidurals but I do feel that patient may still have the synovial cyst that continues to probably push and impinge on the nerve.  Patient has failed all other conservative therapy including medications.  Will try Celebrex again at this time.  Do feel that at this point patient could consider the potential of surgical intervention would like to refer patient to back specialist to discuss the possibility of this.  Patient is still having this affecting daily activities.  Patient is finally increasing activity after his knee  replacements are possible.  Degenerative arthritis of knee, bilateral Status post bilateral replacements and doing well.  SI (sacroiliac) joint dysfunction Attempted osteopathic manipulation.  Varying degrees of success over the course of time.  Once again do feel the synovial cyst that was noted on his MRI previously could be potentially contributing to the chronic discomfort that he continues to have.  Will have patient follow-up with me again 6 to 8  weeks    Nonallopathic problems  Decision today to treat with OMT was based on Physical Exam  After verbal consent patient was treated with HVLA, ME, FPR techniques in cervical, rib, thoracic, lumbar, and sacral  areas  Patient tolerated the procedure well with improvement in symptoms  Patient given exercises, stretches and lifestyle modifications  See medications in patient instructions if given  Patient will follow up in 4-8 weeks     The above documentation has been reviewed and is accurate and complete [name], DO         Note: This dictation was prepared with Dragon dictation along with smaller phrase technology. Any transcriptional errors that result from this process are unintentional.

## 2022-12-24 ENCOUNTER — Ambulatory Visit (INDEPENDENT_AMBULATORY_CARE_PROVIDER_SITE_OTHER): Payer: 59 | Admitting: Family Medicine

## 2022-12-24 ENCOUNTER — Encounter: Payer: Self-pay | Admitting: Family Medicine

## 2022-12-24 VITALS — BP 140/72 | HR 79 | Ht 70.0 in | Wt 204.0 lb

## 2022-12-24 DIAGNOSIS — M9902 Segmental and somatic dysfunction of thoracic region: Secondary | ICD-10-CM

## 2022-12-24 DIAGNOSIS — M533 Sacrococcygeal disorders, not elsewhere classified: Secondary | ICD-10-CM | POA: Diagnosis not present

## 2022-12-24 DIAGNOSIS — M9908 Segmental and somatic dysfunction of rib cage: Secondary | ICD-10-CM | POA: Diagnosis not present

## 2022-12-24 DIAGNOSIS — M9901 Segmental and somatic dysfunction of cervical region: Secondary | ICD-10-CM

## 2022-12-24 DIAGNOSIS — M170 Bilateral primary osteoarthritis of knee: Secondary | ICD-10-CM

## 2022-12-24 DIAGNOSIS — M5416 Radiculopathy, lumbar region: Secondary | ICD-10-CM

## 2022-12-24 DIAGNOSIS — M9903 Segmental and somatic dysfunction of lumbar region: Secondary | ICD-10-CM | POA: Diagnosis not present

## 2022-12-24 MED ORDER — CELECOXIB 200 MG PO CAPS: 200.0000 mg | ORAL_CAPSULE | Freq: Two times a day (BID) | ORAL | 3 refills | Status: DC

## 2022-12-24 NOTE — Assessment & Plan Note (Signed)
Status post bilateral replacements and doing well.

## 2022-12-24 NOTE — Assessment & Plan Note (Signed)
Patient is responding somewhat to the epidurals but I do feel that patient may still have the synovial cyst that continues to probably push and impinge on the nerve.  Patient has failed all other conservative therapy including medications.  Will try Celebrex again at this time.  Do feel that at this point patient could consider the potential of surgical intervention would like to refer patient to back specialist to discuss the possibility of this.  Patient is still having this affecting daily activities.  Patient is finally increasing activity after his knee replacements are possible.

## 2022-12-24 NOTE — Assessment & Plan Note (Signed)
Attempted osteopathic manipulation.  Varying degrees of success over the course of time.  Once again do feel the synovial cyst that was noted on his MRI previously could be potentially contributing to the chronic discomfort that he continues to have.  Will have patient follow-up with me again 6 to 8 weeks

## 2022-12-24 NOTE — Patient Instructions (Addendum)
Celebrex 200mg  2x daily Ortho referral See you again in 2 months

## 2022-12-26 ENCOUNTER — Encounter: Payer: Self-pay | Admitting: Family Medicine

## 2023-01-20 NOTE — Progress Notes (Unsigned)
[name] [address] [place] [address] Phone: [phone] Subjective:   [name], am serving as a scribe for Dr. Antoine Primas.  I'm seeing this patient by the request  of:  Medicine, [facility]  CC: back and neck pain follow up   [id number]  Joshua [name] is a 52 y.o. male coming in with complaint of back and neck pain. OMT 12/24/2022. Patient states doing well. Had meeting with surgeon. No new concerns.  Still having back pain on a daily basis.  Can affect daily activities.  Still not being as active as he would normally like to be.  Medications patient has been prescribed: Celebrex  Taking:         Reviewed prior external information including notes and imaging from previsou exam, outside providers and external EMR if available.   As well as notes that were available from care everywhere and other healthcare systems.  Past medical history, social, surgical and family history all reviewed in electronic medical record.  No pertanent information unless stated regarding to the chief complaint.   Past Medical History:  Diagnosis Date   Alcoholism (HCC)    Anxiety and depression    BPH with obstruction/lower urinary tract symptoms    Chronic back pain    sports med   Chronic pain of right knee    Chronic pain of right knee    multiple surgeries.  Baker's cyst aspirated/injected by Dr. Katrinka Blazing 2022-->good results   History of kidney stones    Hypogonadism in male    OAB (overactive bladder)    urol   Sleep apnea    Stenosing tenosynovitis    R thumb and L middle finger-->good response to inj by ortho 07/2020    No Known Allergies   Review of Systems:  No headache, visual changes, nausea, vomiting, diarrhea, constipation, dizziness, abdominal pain, skin rash, fevers, chills, night sweats, weight loss, swollen lymph nodes, body aches, joint swelling, chest pain, shortness of breath, mood changes. POSITIVE  muscle aches  Objective  Blood pressure (!) 166/66, pulse 70, height 5\' 10"  (1.778 m), weight 202 lb (91.6 kg), SpO2 94%.   General: No apparent distress alert and oriented x3 mood and affect normal, dressed appropriately.  HEENT: Pupils equal, extraocular movements intact  Respiratory: Patient's speak in full sentences and does not appear short of breath  Cardiovascular: No lower extremity edema, non tender, no erythema  Gait MSK:  Back low back does have some loss lordosis.  Tightness with straight leg test.  Negative radicular symptoms at the moment though.  Patient has good grip strength in the hands.  Osteopathic findings C7 flexed rotated and side bent right T9 extended rotated and side bent left inhaled rib. L2 flexed rotated and side bent right L4 flexed rotated and side bent left Sacrum right on right       Assessment and Plan:  Lumbar radiculopathy Known arthritic changes, has seen 2 different surgeons at this time who also want to wait and see how patient responds to the conservative therapy still.  He is responding to osteopathic manipulation as well as the Celebrex.  Refill given today.  Can have epidurals as needed and last 1 was greater than 7 weeks ago at this moment.  Follow-up with me again in 2 months    Nonallopathic problems  Decision today to treat with OMT was based on Physical Exam  After verbal consent patient was treated with HVLA,  ME, FPR techniques in cervical, rib, thoracic, lumbar, and sacral  areas  Patient tolerated the procedure well with improvement in symptoms  Patient given exercises, stretches and lifestyle modifications  See medications in patient instructions if given  Patient will follow up in 4-8 weeks      The above documentation has been reviewed and is accurate and complete [name], DO        Note: This dictation was prepared with Dragon dictation along with smaller phrase technology. Any transcriptional errors  that result from this process are unintentional.

## 2023-01-21 ENCOUNTER — Encounter: Payer: Self-pay | Admitting: Family Medicine

## 2023-01-21 ENCOUNTER — Ambulatory Visit: Payer: 59 | Admitting: Family Medicine

## 2023-01-21 VITALS — BP 166/66 | HR 70 | Ht 70.0 in | Wt 202.0 lb

## 2023-01-21 DIAGNOSIS — M5416 Radiculopathy, lumbar region: Secondary | ICD-10-CM

## 2023-01-21 DIAGNOSIS — M9902 Segmental and somatic dysfunction of thoracic region: Secondary | ICD-10-CM

## 2023-01-21 DIAGNOSIS — M9908 Segmental and somatic dysfunction of rib cage: Secondary | ICD-10-CM

## 2023-01-21 DIAGNOSIS — M9904 Segmental and somatic dysfunction of sacral region: Secondary | ICD-10-CM

## 2023-01-21 DIAGNOSIS — M9903 Segmental and somatic dysfunction of lumbar region: Secondary | ICD-10-CM

## 2023-01-21 DIAGNOSIS — M9901 Segmental and somatic dysfunction of cervical region: Secondary | ICD-10-CM | POA: Diagnosis not present

## 2023-01-21 MED ORDER — CELECOXIB 200 MG PO CAPS: 200.0000 mg | ORAL_CAPSULE | Freq: Two times a day (BID) | ORAL | 3 refills | Status: DC

## 2023-01-21 NOTE — Assessment & Plan Note (Signed)
Known arthritic changes, has seen 2 different surgeons at this time who also want to wait and see how patient responds to the conservative therapy still.  He is responding to osteopathic manipulation as well as the Celebrex.  Refill given today.  Can have epidurals as needed and last 1 was greater than 7 weeks ago at this moment.  Follow-up with me again in 2 months

## 2023-01-21 NOTE — Patient Instructions (Signed)
Good to see you! Careful with blood restriction See you again in 2 months

## 2023-03-06 ENCOUNTER — Other Ambulatory Visit: Payer: Self-pay

## 2023-03-06 ENCOUNTER — Ambulatory Visit (INDEPENDENT_AMBULATORY_CARE_PROVIDER_SITE_OTHER): Payer: 59 | Admitting: Podiatry

## 2023-03-06 DIAGNOSIS — M79672 Pain in left foot: Secondary | ICD-10-CM

## 2023-03-06 DIAGNOSIS — Z91199 Patient's noncompliance with other medical treatment and regimen due to unspecified reason: Secondary | ICD-10-CM

## 2023-03-06 NOTE — Progress Notes (Signed)
No show

## 2023-03-24 NOTE — Progress Notes (Unsigned)
[name] [address] [place] [address] Phone: [phone] Subjective:   Joshua [name], am serving as a scribe for Dr. Antoine [name].  I'm seeing this patient by the request  of:  Medicine, [facility]  CC: Low back pain follow-up  [id number]  Joshua [name] is a 52 y.o. male coming in with complaint of back and neck pain. OMT 01/21/2023.  To have some discomfort and pain.  continue patient states that it can be affecting some of his daily activities.  Likes to be very active including doing jujitsu and sometimes it does seem to stop him from that activity.  Patient also does a lot of manual labor.  Medications patient has been prescribed: None  Taking:         Reviewed prior external information including notes and imaging from previsou exam, outside providers and external EMR if available.   As well as notes that were available from care everywhere and other healthcare systems.  Past medical history, social, surgical and family history all reviewed in electronic medical record.  No pertanent information unless stated regarding to the chief complaint.   Past Medical History:  Diagnosis Date   Alcoholism (HCC)    Anxiety and depression    BPH with obstruction/lower urinary tract symptoms    Chronic back pain    sports med   Chronic pain of right knee    Chronic pain of right knee    multiple surgeries.  Baker's cyst aspirated/injected by Dr. Katrinka [name] 2022-->good results   History of kidney stones    Hypogonadism in male    OAB (overactive bladder)    urol   Sleep apnea    Stenosing tenosynovitis    R thumb and L middle finger-->good response to inj by ortho 07/2020    No Known Allergies   Review of Systems:  No headache, visual changes, nausea, vomiting, diarrhea, constipation, dizziness, abdominal pain, skin rash, fevers, chills, night sweats, weight loss, swollen lymph nodes, body aches, joint swelling,  chest pain, shortness of breath, mood changes. POSITIVE muscle aches  Objective  Blood pressure 124/76, pulse 72, height 5\' 10"  (1.778 m), weight 203 lb (92.1 kg), SpO2 96%.   General: No apparent distress alert and oriented x3 mood and affect normal, dressed appropriately.  HEENT: Pupils equal, extraocular movements intact  Respiratory: Patient's speak in full sentences and does not appear short of breath  Cardiovascular: No lower extremity edema, non tender, no erythema  Low back exam does have some loss of lordosis noted.  Some tenderness to palpation of the paraspinal musculature.  Does have significant discomfort over the sacroiliac joint.  Osteopathic findings  C3 flexed rotated and side bent right T4 extended rotated and side bent right inhaled rib T5 extended rotated and side bent left L2 flexed rotated and side bent right Sacrum right on right    Assessment and Plan:  Lumbar radiculopathy Discussed icing regimen and home exercises.  Discussed which activities to do and which ones to avoid.  Patient is doing relatively well but can consider the possibility of an epidural again if needed.  Refilled medications including gabapentin to help him with sleep.  Follow-up again in 6 to 8 weeks    Nonallopathic problems  Decision today to treat with OMT was based on Physical Exam  After verbal consent patient was treated with HVLA, ME, FPR techniques in cervical, rib, thoracic, lumbar, and sacral  areas  Patient tolerated the  procedure well with improvement in symptoms  Patient given exercises, stretches and lifestyle modifications  See medications in patient instructions if given  Patient will follow up in 4-8 weeks     The above documentation has been reviewed and is accurate and complete Joshua Saa, DO         Note: This dictation was prepared with Dragon dictation along with smaller phrase technology. Any transcriptional errors that result from this process  are unintentional.

## 2023-03-25 ENCOUNTER — Encounter: Payer: Self-pay | Admitting: Family Medicine

## 2023-03-25 ENCOUNTER — Ambulatory Visit (INDEPENDENT_AMBULATORY_CARE_PROVIDER_SITE_OTHER): Payer: 59 | Admitting: Family Medicine

## 2023-03-25 VITALS — BP 124/76 | HR 72 | Ht 70.0 in | Wt 203.0 lb

## 2023-03-25 DIAGNOSIS — M9904 Segmental and somatic dysfunction of sacral region: Secondary | ICD-10-CM

## 2023-03-25 DIAGNOSIS — M9903 Segmental and somatic dysfunction of lumbar region: Secondary | ICD-10-CM

## 2023-03-25 DIAGNOSIS — M5416 Radiculopathy, lumbar region: Secondary | ICD-10-CM | POA: Diagnosis not present

## 2023-03-25 DIAGNOSIS — M9901 Segmental and somatic dysfunction of cervical region: Secondary | ICD-10-CM

## 2023-03-25 DIAGNOSIS — M9902 Segmental and somatic dysfunction of thoracic region: Secondary | ICD-10-CM | POA: Diagnosis not present

## 2023-03-25 DIAGNOSIS — M9908 Segmental and somatic dysfunction of rib cage: Secondary | ICD-10-CM | POA: Diagnosis not present

## 2023-03-25 MED ORDER — CELECOXIB 200 MG PO CAPS: 200.0000 mg | ORAL_CAPSULE | Freq: Two times a day (BID) | ORAL | 3 refills | Status: DC

## 2023-03-25 MED ORDER — GABAPENTIN 100 MG PO CAPS: 200.0000 mg | ORAL_CAPSULE | Freq: Every day | ORAL | 0 refills | Status: AC

## 2023-03-25 NOTE — Patient Instructions (Addendum)
Prescriptions filled Tart Cherry 2400mg  at night See you again in 2-3 months

## 2023-03-25 NOTE — Assessment & Plan Note (Signed)
Discussed icing regimen and home exercises.  Discussed which activities to do and which ones to avoid.  Patient is doing relatively well but can consider the possibility of an epidural again if needed.  Refilled medications including gabapentin to help him with sleep.  Follow-up again in 6 to 8 weeks

## 2023-05-26 NOTE — Progress Notes (Unsigned)
[name] [address] [place] [address] Phone: [phone] Subjective:   Joshua [name], am serving as a scribe for Dr. Antoine Primas.  I'm seeing this patient by the request  of:  Medicine, [facility]  CC: Back and neck pain follow-up, patient does have finger pain as well noted.  [id number]  Joshua [name] is a 52 y.o. male coming in with complaint of back and neck pain. OMT 03/25/2023. Patient states same per usual. Can't bend fingers and toes.  Medications patient has been prescribed: Celebrex, Gabapentin  Taking:         Reviewed prior external information including notes and imaging from previsou exam, outside providers and external EMR if available.   As well as notes that were available from care everywhere and other healthcare systems.  Past medical history, social, surgical and family history all reviewed in electronic medical record.  No pertanent information unless stated regarding to the chief complaint.   Past Medical History:  Diagnosis Date   Alcoholism (HCC)    Anxiety and depression    BPH with obstruction/lower urinary tract symptoms    Chronic back pain    sports med   Chronic pain of right knee    Chronic pain of right knee    multiple surgeries.  Baker's cyst aspirated/injected by Dr. Katrinka Blazing 2022-->good results   History of kidney stones    Hypogonadism in male    OAB (overactive bladder)    urol   Sleep apnea    Stenosing tenosynovitis    R thumb and L middle finger-->good response to inj by ortho 07/2020    No Known Allergies   Review of Systems:  No headache, visual changes, nausea, vomiting, diarrhea, constipation, dizziness, abdominal pain, skin rash, fevers, chills, night sweats, weight loss, swollen lymph nodes, body aches, joint swelling, chest pain, shortness of breath, mood changes. POSITIVE muscle aches  Objective  Blood pressure (!) 136/90, pulse 72, height 5\' 10"   (1.778 m), weight 204 lb (92.5 kg), SpO2 98%.   General: No apparent distress alert and oriented x3 mood and affect normal, dressed appropriately.  HEENT: Pupils equal, extraocular movements intact  Respiratory: Patient's speak in full sentences and does not appear short of breath  Cardiovascular: No lower extremity edema, non tender, no erythema  Gait relatively normal MSK:  Back does have some loss lordosis noted.  Some tightness noted in the paraspinal musculature and seems to be more in the sacroiliac joint. Hand exam does have a trigger nodule noted over the A2 pulley of the index finger on the right hand.  Active triggering noted.  Procedure: Real-time Ultrasound Guided Injection of right index flexor tendon sheath Device: GE Logiq Q7 Ultrasound guided injection is preferred based studies that show increased duration, increased effect, greater accuracy, decreased procedural pain, increased response rate, and decreased cost with ultrasound guided versus blind injection.  Verbal informed consent obtained.  Time-out conducted.  Noted no overlying erythema, induration, or other signs of local infection.  Skin prepped in a sterile fashion.  Local anesthesia: Topical Ethyl chloride.  With sterile technique and under real time ultrasound guidance: With a 25-gauge half inch needle injected with 0.5 cc of 0.5% Marcaine and 0.5 cc of Kenalog 40 mg/mL. Completed without difficulty  Pain immediately resolved suggesting accurate placement of the medication.  Advised to call if fevers/chills, erythema, induration, drainage, or persistent bleeding.  Impression: Technically successful ultrasound guided injection.  Osteopathic findings C5  flexed rotated and side bent left T3 extended rotated and side bent right inhaled rib T9 extended rotated and side bent left L3 flexed rotated and side bent right Sacrum right on right    Assessment and Plan:  Trigger finger, right Injection given today,  discussed massage, patient has responded well to a trigger finger injection previously.  Discussed icing regimen and home exercises.  Increase activity slowly otherwise.  Follow-up again in 6 to 8 weeks    Nonallopathic problems  Decision today to treat with OMT was based on Physical Exam  After verbal consent patient was treated with HVLA, ME, FPR techniques in cervical, rib, thoracic, lumbar, and sacral  areas  Patient tolerated the procedure well with improvement in symptoms  Patient given exercises, stretches and lifestyle modifications  See medications in patient instructions if given  Patient will follow up in 4-8 weeks     The above documentation has been reviewed and is accurate and complete [name], DO         Note: This dictation was prepared with Dragon dictation along with smaller phrase technology. Any transcriptional errors that result from this process are unintentional.

## 2023-05-27 ENCOUNTER — Encounter: Payer: Self-pay | Admitting: Family Medicine

## 2023-05-27 ENCOUNTER — Ambulatory Visit (HOSPITAL_BASED_OUTPATIENT_CLINIC_OR_DEPARTMENT_OTHER): Payer: 59 | Admitting: Orthopaedic Surgery

## 2023-05-27 ENCOUNTER — Ambulatory Visit: Payer: 59 | Admitting: Family Medicine

## 2023-05-27 ENCOUNTER — Ambulatory Visit (HOSPITAL_BASED_OUTPATIENT_CLINIC_OR_DEPARTMENT_OTHER): Payer: 59

## 2023-05-27 ENCOUNTER — Ambulatory Visit: Payer: Self-pay

## 2023-05-27 VITALS — BP 136/90 | HR 72 | Ht 70.0 in | Wt 204.0 lb

## 2023-05-27 DIAGNOSIS — M79672 Pain in left foot: Secondary | ICD-10-CM

## 2023-05-27 DIAGNOSIS — M9904 Segmental and somatic dysfunction of sacral region: Secondary | ICD-10-CM

## 2023-05-27 DIAGNOSIS — M79671 Pain in right foot: Secondary | ICD-10-CM

## 2023-05-27 DIAGNOSIS — M9902 Segmental and somatic dysfunction of thoracic region: Secondary | ICD-10-CM | POA: Diagnosis not present

## 2023-05-27 DIAGNOSIS — M9901 Segmental and somatic dysfunction of cervical region: Secondary | ICD-10-CM

## 2023-05-27 DIAGNOSIS — M6530 Trigger finger, unspecified finger: Secondary | ICD-10-CM | POA: Diagnosis not present

## 2023-05-27 DIAGNOSIS — M9903 Segmental and somatic dysfunction of lumbar region: Secondary | ICD-10-CM

## 2023-05-27 DIAGNOSIS — M65321 Trigger finger, right index finger: Secondary | ICD-10-CM | POA: Diagnosis not present

## 2023-05-27 DIAGNOSIS — M5416 Radiculopathy, lumbar region: Secondary | ICD-10-CM

## 2023-05-27 DIAGNOSIS — M9908 Segmental and somatic dysfunction of rib cage: Secondary | ICD-10-CM

## 2023-05-27 NOTE — Progress Notes (Signed)
Chief Complaint: Bilateral second toe mallet toe     History of Present Illness:    Joshua [name] is a 52 y.o. male presents with bilateral mallet toes involving the second toe particularly after previous traumatic injury to his previous second toe on the left foot.  He also does have a history of a right fourth toe curly toe which has some pretty significant callus and irritation on this.  Left second toe has been bothering him after previous fracture and is subsequently developed a cyst which has decompressed.  This is not significantly painful but does cause irritation on the mat during jujitsu.  He is very active.    PMH/PSH/Family History/Social History/Meds/Allergies:    Past Medical History:  Diagnosis Date   Alcoholism (HCC)    Anxiety and depression    BPH with obstruction/lower urinary tract symptoms    Chronic back pain    sports med   Chronic pain of right knee    Chronic pain of right knee    multiple surgeries.  Baker's cyst aspirated/injected by Dr. Katrinka Blazing 2022-->good results   History of kidney stones    Hypogonadism in male    OAB (overactive bladder)    urol   Sleep apnea    Stenosing tenosynovitis    R thumb and L middle finger-->good response to inj by ortho 07/2020   Past Surgical History:  Procedure Laterality Date   PARTIAL KNEE ARTHROPLASTY Right 05/12/2022   Procedure: RIGHT UNICOMPARTMENTAL KNEE;  Surgeon: Tarry Kos, MD;  Location: [facility] OR;  Service: Orthopedics;  Laterality: Right;   righ knee surgery     5+.  Meniscectomy among others   Social History   Socioeconomic History   Marital status: Married    Spouse name: Not on file   Number of children: Not on file   Years of education: Not on file   Highest education level: Not on file  Occupational History   Not on file  Tobacco Use   Smoking status: Never   Smokeless tobacco: Former  Advertising account planner   Vaping status: Never Used  Substance and Sexual Activity   Alcohol use: Yes     Alcohol/week: 3.0 standard drinks of alcohol    Types: 3 Cans of beer per week   Drug use: Not on file   Sexual activity: Not on file  Other Topics Concern   Not on file  Social History Narrative   Not on file   Social Determinants of Health   Financial Resource Strain: Low Risk  (08/22/2022)   Received from [facility], [facility]   Overall Financial Resource Strain (CARDIA)    Difficulty of Paying Living Expenses: Not hard at all  Food Insecurity: No Food Insecurity (08/22/2022)   Received from [facility], [facility]   Hunger Vital Sign    Worried About Running Out of Food in the Last Year: Never true    Ran Out of Food in the Last Year: Never true  Transportation Needs: No Transportation Needs (08/22/2022)   Received from [facility], [facility]   PRAPARE - Transportation    Lack of Transportation (Medical): No    Lack of Transportation (Non-Medical): No  Physical Activity: Not on file  Stress: Not on file  Social Connections: Unknown (11/05/2021)   Received from [facility], [facility]   Social Network    Social Network: Not on file   No family history on file. No Known Allergies Current Outpatient Medications  Medication Sig Dispense Refill   amoxicillin (AMOXIL) 500 MG capsule Take four pills one hour prior to dental work 8 capsule 2   celecoxib (CELEBREX) 200 MG capsule Take 1 capsule (200 mg total) by mouth 2 (two) times daily. 60 capsule 3   doxycycline (VIBRA-TABS) 100 MG tablet Take 1 tablet (100 mg total) by mouth 2 (two) times daily. 14 tablet 0   gabapentin (NEURONTIN) 100 MG capsule Take 2 capsules (200 mg total) by mouth at bedtime. 180 capsule 0   gabapentin (NEURONTIN) 100 MG capsule Take 2 capsules (200 mg total) by mouth at bedtime. 180 capsule 0   gabapentin (NEURONTIN) 300 MG capsule Take 300 mg by mouth daily as needed (pain).     ketorolac (TORADOL) 10 MG tablet Take 1 tablet (10 mg total) by mouth 2 (two) times daily as needed. 10  tablet 0   meloxicam (MOBIC) 15 MG tablet Take 1 tablet (15 mg total) by mouth daily. 30 tablet 0   methocarbamol (ROBAXIN-750) 750 MG tablet Take 1 tablet (750 mg total) by mouth 2 (two) times daily as needed for muscle spasms. 20 tablet 2   methocarbamol (ROBAXIN-750) 750 MG tablet Take 1 tablet (750 mg total) by mouth 3 (three) times daily as needed for muscle spasms. 30 tablet 2   montelukast (SINGULAIR) 10 MG tablet TAKE 1 TABLET BY MOUTH AT BEDTIME 30 tablet 0   NON FORMULARY Pt uses a cpap nightly     ondansetron (ZOFRAN) 4 MG tablet Take 1 tablet (4 mg total) by mouth every 8 (eight) hours as needed for nausea or vomiting. 40 tablet 0   oxyCODONE-acetaminophen (PERCOCET) 7.5-325 MG tablet Take 1-2 tabs po every 6-8 hours prn pain 40 tablet 0   predniSONE (DELTASONE) 50 MG tablet Take 1 tablet (50 mg total) by mouth daily. 5 tablet 0   silodosin (RAPAFLO) 8 MG CAPS capsule Take 8 mg by mouth daily.     testosterone cypionate (DEPOTESTOSTERONE CYPIONATE) 200 MG/ML injection Inject 100 mg into the muscle every 7 (seven) days.     Vitamin D, Ergocalciferol, (DRISDOL) 1.25 MG (50000 UNIT) CAPS capsule TAKE 1 CAPSULE (50,000 UNITS TOTAL) BY MOUTH EVERY 7 (SEVEN) DAYS 12 capsule 0   No current facility-administered medications for this visit.   No results found.  Review of Systems:   A ROS was performed including pertinent positives and negatives as documented in the HPI.  Physical Exam :   Constitutional: NAD and appears stated age Neurological: Alert and oriented Psych: Appropriate affect and cooperative There were no vitals taken for this visit.   Comprehensive Musculoskeletal Exam:    Bilateral second toe mallet toe with a left DIP cyst in the setting of previous posttraumatic deformity.  Sensation is intact throughout.  Has a right fourth toe curly toe with underlying callus   Imaging:   Xray (right foot 4 views, left foot 4 views): Bilateral second toe mallet toe     I  personally reviewed and interpreted the radiographs.   Assessment and Plan:   52 y.o. 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  I did describe that the treatment for this would ultimately be a DIP excision and pinning although he would like defer this at this time.  He also does have some evidence of metatarsalgia on the right foot for which I recommended a metatarsal pad.  I would also plan on doing a referral to podiatry  as he does have some callus which is bothering him on the right fourth toe in the setting of a curly toe.  -I will plan to see him back as needed   I personally saw and evaluated the patient, and participated in the management and treatment plan.  Huel Cote, MD Attending Physician, Orthopedic Surgery  This document was dictated using Dragon voice recognition software. A reasonable attempt at proof reading has been made to minimize errors.

## 2023-05-27 NOTE — Patient Instructions (Addendum)
Injection for trigger finger Toe should be fine See you again in 2 months

## 2023-05-27 NOTE — Assessment & Plan Note (Signed)
Injection given today, discussed massage, patient has responded well to a trigger finger injection previously.  Discussed icing regimen and home exercises.  Increase activity slowly otherwise.  Follow-up again in 6 to 8 weeks

## 2023-05-27 NOTE — Assessment & Plan Note (Signed)
Chronic problem with also exacerbation noted.  Worsening symptoms.  Has been sometime since we have seen patient.  Discussed icing regimen and home exercises, increase activity slowly.  Follow-up with me again in 6 to 8 weeks.  Discussed with patient Celebrex 100 mg twice a day as needed

## 2023-06-08 ENCOUNTER — Ambulatory Visit: Payer: 59 | Admitting: Podiatry

## 2023-07-15 NOTE — Progress Notes (Signed)
[name] [address] [place] [address] Phone: [phone] Subjective:   [name], am serving as a scribe for Dr. Antoine Primas.  I'm seeing this patient by the request  of:  McClanahan, Bella Kennedy, NP  CC: Back and neck pain follow-up  [id number]  Joshua [name] is a 53 y.o. male coming in with complaint of back and neck pain. OMT 05/27/2023. Patient states same per usual. No new concerns.  Medications patient has been prescribed: Celebrex, Gabapentin  Taking:         Reviewed prior external information including notes and imaging from previsou exam, outside providers and external EMR if available.   As well as notes that were available from care everywhere and other healthcare systems.  Past medical history, social, surgical and family history all reviewed in electronic medical record.  No pertanent information unless stated regarding to the chief complaint.   Past Medical History:  Diagnosis Date   Alcoholism (HCC)    Anxiety and depression    BPH with obstruction/lower urinary tract symptoms    Chronic back pain    sports med   Chronic pain of right knee    Chronic pain of right knee    multiple surgeries.  Baker's cyst aspirated/injected by Dr. Katrinka Blazing 2022-->good results   History of kidney stones    Hypogonadism in male    OAB (overactive bladder)    urol   Sleep apnea    Stenosing tenosynovitis    R thumb and L middle finger-->good response to inj by ortho 07/2020    No Known Allergies   Review of Systems:  No headache, visual changes, nausea, vomiting, diarrhea, constipation, dizziness, abdominal pain, skin rash, fevers, chills, night sweats, weight loss, swollen lymph nodes, body aches, joint swelling, chest pain, shortness of breath, mood changes. POSITIVE muscle aches  Objective  Blood pressure 133/88, pulse 67, height 5\' 10"  (1.778 m), weight 211 lb (95.7 kg), SpO2 96%.   General: No apparent distress alert  and oriented x3 mood and affect normal, dressed appropriately.  HEENT: Pupils equal, extraocular movements intact  Respiratory: Patient's speak in full sentences and does not appear short of breath  Cardiovascular: No lower extremity edema, non tender, no erythema  Gait MSK:  Back does have some loss lordosis noted.  Some tenderness to palpation in the paraspinal musculature.  Tightness even in the gluteal area.  Trigger nodule noted of the index finger on the right side.  Osteopathic findings  C6 flexed rotated and side bent left T3 extended rotated and side bent right inhaled rib T9 extended rotated and side bent left L2 flexed rotated and side bent right L4 flexed rotated and side bent left L5 flexed rotated and side bent left Sacrum right on right   After verbal consent patient was prepped with alcohol swab and with a 25-gauge half inch needle injected with 0.5 cc of 0.5% Marcaine and 0.5 cc of Kenalog 40 mg/mL.  This was in the flexor tendon sheath on the right hand no blood loss, Band-Aid placed.  Postinjection instructions given    Assessment and Plan:  Trigger finger, right Doing much better at this time.  No change in management.  SI (sacroiliac) joint dysfunction Continue chronic problem, chronic exacerbation.  Discussed still some medications including gabapentin.  Continue to work on core strength.  Still very highly active individual with both his job as well as conditioning.  Follow-up again in 6 to 8 weeks  Degenerative arthritis of knee, bilateral Continue to monitor.    Nonallopathic problems  Decision today to treat with OMT was based on Physical Exam  After verbal consent patient was treated with HVLA, ME, FPR techniques in cervical, rib, thoracic, lumbar, and sacral  areas  Patient tolerated the procedure well with improvement in symptoms  Patient given exercises, stretches and lifestyle modifications  See medications in patient instructions if  given  Patient will follow up in 4-8 weeks    The above documentation has been reviewed and is accurate and complete [name], DO          Note: This dictation was prepared with Dragon dictation along with smaller phrase technology. Any transcriptional errors that result from this process are unintentional.

## 2023-07-16 ENCOUNTER — Ambulatory Visit (INDEPENDENT_AMBULATORY_CARE_PROVIDER_SITE_OTHER): Payer: 59 | Admitting: Family Medicine

## 2023-07-16 VITALS — BP 133/88 | HR 67 | Ht 70.0 in | Wt 211.0 lb

## 2023-07-16 DIAGNOSIS — M9904 Segmental and somatic dysfunction of sacral region: Secondary | ICD-10-CM

## 2023-07-16 DIAGNOSIS — M65321 Trigger finger, right index finger: Secondary | ICD-10-CM

## 2023-07-16 DIAGNOSIS — M9902 Segmental and somatic dysfunction of thoracic region: Secondary | ICD-10-CM

## 2023-07-16 DIAGNOSIS — M533 Sacrococcygeal disorders, not elsewhere classified: Secondary | ICD-10-CM | POA: Diagnosis not present

## 2023-07-16 DIAGNOSIS — M9901 Segmental and somatic dysfunction of cervical region: Secondary | ICD-10-CM

## 2023-07-16 DIAGNOSIS — M9903 Segmental and somatic dysfunction of lumbar region: Secondary | ICD-10-CM

## 2023-07-16 DIAGNOSIS — M6530 Trigger finger, unspecified finger: Secondary | ICD-10-CM

## 2023-07-16 DIAGNOSIS — M170 Bilateral primary osteoarthritis of knee: Secondary | ICD-10-CM | POA: Diagnosis not present

## 2023-07-16 DIAGNOSIS — M9908 Segmental and somatic dysfunction of rib cage: Secondary | ICD-10-CM

## 2023-07-16 NOTE — Patient Instructions (Signed)
Injection today See you again in 7-8 months

## 2023-07-18 ENCOUNTER — Encounter: Payer: Self-pay | Admitting: Family Medicine

## 2023-07-18 NOTE — Assessment & Plan Note (Signed)
Continue to monitor

## 2023-07-18 NOTE — Assessment & Plan Note (Signed)
Continue chronic problem, chronic exacerbation.  Discussed still some medications including gabapentin.  Continue to work on core strength.  Still very highly active individual with both his job as well as conditioning.  Follow-up again in 6 to 8 weeks

## 2023-07-18 NOTE — Assessment & Plan Note (Addendum)
Doing much better at this time after injection.Marland Kitchen  No change in management.  Still discussed potential bracing.

## 2023-08-06 ENCOUNTER — Other Ambulatory Visit: Payer: Self-pay | Admitting: Family Medicine

## 2023-08-28 ENCOUNTER — Encounter: Payer: Self-pay | Admitting: Family Medicine

## 2023-08-28 ENCOUNTER — Ambulatory Visit: Admitting: Family Medicine

## 2023-08-28 VITALS — BP 122/74 | HR 74 | Ht 70.0 in | Wt 210.0 lb

## 2023-08-28 DIAGNOSIS — M533 Sacrococcygeal disorders, not elsewhere classified: Secondary | ICD-10-CM

## 2023-08-28 DIAGNOSIS — M9902 Segmental and somatic dysfunction of thoracic region: Secondary | ICD-10-CM

## 2023-08-28 DIAGNOSIS — M2550 Pain in unspecified joint: Secondary | ICD-10-CM | POA: Diagnosis not present

## 2023-08-28 DIAGNOSIS — M9903 Segmental and somatic dysfunction of lumbar region: Secondary | ICD-10-CM | POA: Diagnosis not present

## 2023-08-28 DIAGNOSIS — M9904 Segmental and somatic dysfunction of sacral region: Secondary | ICD-10-CM

## 2023-08-28 MED ORDER — PREDNISONE 50 MG PO TABS: ORAL_TABLET | ORAL | 0 refills | Status: AC

## 2023-08-28 MED ORDER — KETOROLAC TROMETHAMINE 60 MG/2ML IM SOLN
60.0000 mg | Freq: Once | INTRAMUSCULAR | Status: AC
  Administered 2023-08-28: 60 mg via INTRAMUSCULAR

## 2023-08-28 MED ORDER — METHYLPREDNISOLONE ACETATE 80 MG/ML IJ SUSP
80.0000 mg | Freq: Once | INTRAMUSCULAR | Status: AC
  Administered 2023-08-28: 80 mg via INTRAMUSCULAR

## 2023-08-28 NOTE — Progress Notes (Signed)
 [name] [address] [place] [address] Phone: [phone] Subjective:   Joshua [name], am serving as a scribe for Dr. Antoine Primas.  I'm seeing this patient by the request  of:  McClanahan, Bella Kennedy, NP  CC: Back and neck pain follow-up  [id number]  Joshua [name] is a 53 y.o. male coming in with complaint of back and neck pain. OMT 07/16/2023. Patient states that the R side of his lower back been painful since ysterday.  Stays localized, no radiation of the pain.  Affecting daily activities though.  Had difficulty moving at all or in any type of positioning.  Medications patient has been prescribed: Celebrex  Taking:         Reviewed prior external information including notes and imaging from previsou exam, outside providers and external EMR if available.   As well as notes that were available from care everywhere and other healthcare systems.  Past medical history, social, surgical and family history all reviewed in electronic medical record.  No pertanent information unless stated regarding to the chief complaint.   Past Medical History:  Diagnosis Date   Alcoholism (HCC)    Anxiety and depression    BPH with obstruction/lower urinary tract symptoms    Chronic back pain    sports med   Chronic pain of right knee    Chronic pain of right knee    multiple surgeries.  Baker's cyst aspirated/injected by Dr. Katrinka Blazing 2022-->good results   History of kidney stones    Hypogonadism in male    OAB (overactive bladder)    urol   Sleep apnea    Stenosing tenosynovitis    R thumb and L middle finger-->good response to inj by ortho 07/2020    No Known Allergies   Review of Systems:  No headache, visual changes, nausea, vomiting, diarrhea, constipation, dizziness, abdominal pain, skin rash, fevers, chills, night sweats, weight loss, swollen lymph nodes, body aches, joint swelling, chest pain, shortness of breath, mood changes. POSITIVE  muscle aches  Objective  Blood pressure 122/74, pulse 74, height 5\' 10"  (1.778 m), weight 210 lb (95.3 kg), SpO2 97%.   General: No apparent distress alert and oriented x3 mood and affect normal, dressed appropriately.  HEENT: Pupils equal, extraocular movements intact  Respiratory: Patient's speak in full sentences and does not appear short of breath  Cardiovascular: No lower extremity edema, non tender, no erythema  Gait relatively normal MSK:  Back severe loss lordosis.  This seems to be significantly tender to palpation in the paraspinal musculature.  Negative straight leg test  Osteopathic findings  T9 extended rotated and side bent left L2 flexed rotated and side bent right L3 flexed rotated and side bent left Sacrum right on right Mild pelvic shear noted.     Assessment and Plan:  SI (sacroiliac) joint dysfunction Chronic problem with a little exacerbation but does appear that there is a new muscle spasm discussed icing regimen and home exercises as well.  Toradol and Depo-Medrol given today.  Increase activity slowly.  Follow-up again in 6 to 8 weeks prednisone for 5 days also prescribed.  If worsening pain or radicular symptoms to seek medical attention sooner.    Nonallopathic problems  Decision today to treat with OMT was based on Physical Exam  After verbal consent patient was treated with HVLA, ME, FPR techniques in  thoracic, lumbar, and sacral  areas  Patient tolerated the procedure well with improvement in symptoms  Patient  given exercises, stretches and lifestyle modifications  See medications in patient instructions if given  Patient will follow up in 4-8 weeks    The above documentation has been reviewed and is accurate and complete [name], DO          Note: This dictation was prepared with Dragon dictation along with smaller phrase technology. Any transcriptional errors that result from this process are unintentional.

## 2023-08-28 NOTE — Assessment & Plan Note (Signed)
 Chronic problem with a little exacerbation but does appear that there is a new muscle spasm discussed icing regimen and home exercises as well.  Toradol and Depo-Medrol given today.  Increase activity slowly.  Follow-up again in 6 to 8 weeks prednisone for 5 days also prescribed.  If worsening pain or radicular symptoms to seek medical attention sooner.

## 2023-08-28 NOTE — Patient Instructions (Addendum)
 Prednisone  Full cocktail given today.  Give me an update on Monday Keep other appt

## 2023-09-02 NOTE — Progress Notes (Unsigned)
 [name] [address] [place] [address] Phone: [phone] Subjective:   [name], am serving as a scribe for Dr. Antoine Primas.  I'm seeing this patient by the request  of:  McClanahan, Bella Kennedy, NP  CC: Back and neck pain follow-up  [id number]  Joshua [name] is a 53 y.o. male coming in with complaint of back and neck pain. OMT 08/28/2023. Patient states that he ist still in constant pain. No relief from injections in backside or course of prednisone.   Medications patient has been prescribed: Celebrex, Prednisone  Taking:         Reviewed prior external information including notes and imaging from previsou exam, outside providers and external EMR if available.   As well as notes that were available from care everywhere and other healthcare systems.  Past medical history, social, surgical and family history all reviewed in electronic medical record.  No pertanent information unless stated regarding to the chief complaint.   Past Medical History:  Diagnosis Date   Alcoholism (HCC)    Anxiety and depression    BPH with obstruction/lower urinary tract symptoms    Chronic back pain    sports med   Chronic pain of right knee    Chronic pain of right knee    multiple surgeries.  Baker's cyst aspirated/injected by Dr. Katrinka Blazing 2022-->good results   History of kidney stones    Hypogonadism in male    OAB (overactive bladder)    urol   Sleep apnea    Stenosing tenosynovitis    R thumb and L middle finger-->good response to inj by ortho 07/2020    No Known Allergies   Review of Systems:  No headache, visual changes, nausea, vomiting, diarrhea, constipation, dizziness, abdominal pain, skin rash, fevers, chills, night sweats, weight loss, swollen lymph nodes, body aches, joint swelling, chest pain, shortness of breath, mood changes. POSITIVE muscle aches  Objective  Blood pressure 124/82, height 5\' 10"  (1.778 m), weight 212 lb  (96.2 kg).   General: No apparent distress alert and oriented x3 mood and affect normal, dressed appropriately.  HEENT: Pupils equal, extraocular movements intact  Respiratory: Patient's speak in full sentences and does not appear short of breath  Cardiovascular: No lower extremity edema, non tender, no erythema  Gait MSK:  Back does have some loss lordosis noted.  Some tenderness to palpation in the paraspinal musculature that is fairly severe overall.  Multiple trigger points noted in the lumbar area.  Seems to be the quadratus lumborum, latissimus dorsi and spinalis muscle.  Osteopathic findings T9 extended rotated and side bent right L2 flexed rotated and side bent right Sacrum right on right   After verbal consent patient was prepped with alcohol swab and with a 25-gauge 1 inch needle injected into 4 distinct trigger points in the quadratus lumborum, latissimus dorsi as well as spinalis muscle.  Total of 3 cc of 0.5% Marcaine and 1 cc of Kenalog 40 mg/mL used.    Assessment and Plan:  Lumbar radiculopathy Worsening discomfort at the moment.  Given some trigger point injections noted.  Concern low but there is some new potential herniated disc with the severity of pain the patient is having or an acute occult fracture.  Discussed with patient about x-rays at this time.  Worsening pain to seek medical attention.  Also do think that worsening pain do need to consider the possibility of advanced imaging.  Pain is affecting daily activities and waking  him up at night and not responding to the medications such as prednisone, Celebrex, and gabapentin.    Nonallopathic problems  Decision today to treat with OMT was based on Physical Exam  After verbal consent patient was treated with HVLA, ME, FPR techniques in cthoracic, lumbar, and sacral  areas  Patient tolerated the procedure well with improvement in symptoms  Patient given exercises, stretches and lifestyle modifications  See  medications in patient instructions if given  Patient will follow up in 4-8 weeks     The above documentation has been reviewed and is accurate and complete [name], DO         Note: This dictation was prepared with Dragon dictation along with smaller phrase technology. Any transcriptional errors that result from this process are unintentional.

## 2023-09-03 ENCOUNTER — Ambulatory Visit: Payer: 59 | Admitting: Family Medicine

## 2023-09-03 VITALS — BP 124/82 | Ht 70.0 in | Wt 212.0 lb

## 2023-09-03 DIAGNOSIS — M9903 Segmental and somatic dysfunction of lumbar region: Secondary | ICD-10-CM | POA: Diagnosis not present

## 2023-09-03 DIAGNOSIS — M5459 Other low back pain: Secondary | ICD-10-CM | POA: Diagnosis not present

## 2023-09-03 DIAGNOSIS — M9902 Segmental and somatic dysfunction of thoracic region: Secondary | ICD-10-CM | POA: Diagnosis not present

## 2023-09-03 DIAGNOSIS — M5416 Radiculopathy, lumbar region: Secondary | ICD-10-CM | POA: Diagnosis not present

## 2023-09-03 DIAGNOSIS — M9904 Segmental and somatic dysfunction of sacral region: Secondary | ICD-10-CM | POA: Diagnosis not present

## 2023-09-03 DIAGNOSIS — M545 Low back pain, unspecified: Secondary | ICD-10-CM

## 2023-09-03 NOTE — Assessment & Plan Note (Signed)
 Patient given injection and tolerated the procedure well, discussed icing regimen, home exercises, which activities to do and which ones to avoid.

## 2023-09-03 NOTE — Patient Instructions (Addendum)
 Trigger point injections today MRI at [facility] See you again in 2 months

## 2023-09-03 NOTE — Assessment & Plan Note (Signed)
 Worsening discomfort at the moment.  Given some trigger point injections noted.  Concern low but there is some new potential herniated disc with the severity of pain the patient is having or an acute occult fracture.  Discussed with patient about x-rays at this time.  Worsening pain to seek medical attention.  Also do think that worsening pain do need to consider the possibility of advanced imaging.  Pain is affecting daily activities and waking him up at night and not responding to the medications such as prednisone, Celebrex, and gabapentin.

## 2023-09-09 ENCOUNTER — Ambulatory Visit
Admission: RE | Admit: 2023-09-09 | Discharge: 2023-09-09 | Disposition: A | Discharge status: Home / Self Care | Source: Ambulatory Visit | Attending: Family Medicine | Admitting: Family Medicine

## 2023-09-09 DIAGNOSIS — M545 Low back pain, unspecified: Secondary | ICD-10-CM

## 2023-09-18 ENCOUNTER — Encounter: Payer: Self-pay | Admitting: Family Medicine

## 2023-10-28 NOTE — Progress Notes (Signed)
 [name] [address] [place] [address] Phone: [phone] Subjective:   Joshua [name], am serving as a scribe for Dr. Ronnell Coins.  I'm seeing this patient by the request  of:  [name], Kyra, NP  CC: back and neck pain follow up   BJY:[id number]  Joshua [name] is a 53 y.o. male coming in with complaint of back and neck pain. OMT 09/03/2023. Patient states feeling it today. Sciatica is worse on L side. L knee still giving him a fit.  Medications patient has been prescribed: Celebrex   Taking: yes     New MRI of the lumbar spine did show unfortunately causing more of a right sided L5 nerve root impingement secondary to worsening facet arthropathy and cystic change.    Reviewed prior external information including notes and imaging from previsou exam, outside providers and external EMR if available.   As well as notes that were available from care everywhere and other healthcare systems.  Past medical history, social, surgical and family history all reviewed in electronic medical record.  No pertanent information unless stated regarding to the chief complaint.   Past Medical History:  Diagnosis Date   Alcoholism (HCC)    Anxiety and depression    BPH with obstruction/lower urinary tract symptoms    Chronic back pain    sports med   Chronic pain of right knee    Chronic pain of right knee    multiple surgeries.  Baker's cyst aspirated/injected by Dr. Felipe [name] 2022-->good results   History of kidney stones    Hypogonadism in male    OAB (overactive bladder)    urol   Sleep apnea    Stenosing tenosynovitis    R thumb and L middle finger-->good response to inj by ortho 07/2020    No Known Allergies   Review of Systems:  No headache, visual changes, nausea, vomiting, diarrhea, constipation, dizziness, abdominal pain, skin rash, fevers, chills, night sweats, weight loss, swollen lymph nodes, body aches, joint swelling, chest pain,  shortness of breath, mood changes. POSITIVE muscle aches  Objective  Blood pressure 126/74, pulse 73, height 5\' 10"  (1.778 m), weight 214 lb (97.1 kg), SpO2 97%.   General: No apparent distress alert and oriented x3 mood and affect normal, dressed appropriately.  HEENT: Pupils equal, extraocular movements intact  Respiratory: Patient's speak in full sentences and does not appear short of breath  Cardiovascular: No lower extremity edema, non tender, no erythema  Gait normal  MSK:  Back loss of lordosis noted. Tightness faber   Osteopathic findings  C3 flexed rotated and side bent right C7 flexed rotated and side bent left T3 extended rotated and side bent right inhaled rib T9 extended rotated and side bent left L2 flexed rotated and side bent right L5 flexed rotated and side bent left Sacrum right on right       Assessment and Plan:  Lumbar radiculopathy Chronic, with exacerbation.  Seems to be at worsening symptoms.  Discussed icing regimen and home exercises, discussed which activities to do and which ones to avoid.  Increase activity slowly otherwise.  Still responding well to osteopathic manipulation.  Follow-up again with me in 6 to 8 weeks    Nonallopathic problems  Decision today to treat with OMT was based on Physical Exam  After verbal consent patient was treated with HVLA, ME, FPR techniques in cervical, rib, thoracic, lumbar, and sacral  areas  Patient tolerated the procedure well with improvement in  symptoms  Patient given exercises, stretches and lifestyle modifications  See medications in patient instructions if given  Patient will follow up in 4-8 weeks    The above documentation has been reviewed and is accurate and complete Joshua [name] M Joshua Brannigan, DO          Note: This dictation was prepared with Dragon dictation along with smaller phrase technology. Any transcriptional errors that result from this process are unintentional.

## 2023-11-03 ENCOUNTER — Ambulatory Visit: Admitting: Family Medicine

## 2023-11-03 ENCOUNTER — Encounter: Payer: Self-pay | Admitting: Family Medicine

## 2023-11-03 VITALS — BP 126/74 | HR 73 | Ht 70.0 in | Wt 214.0 lb

## 2023-11-03 DIAGNOSIS — M9901 Segmental and somatic dysfunction of cervical region: Secondary | ICD-10-CM

## 2023-11-03 DIAGNOSIS — M9904 Segmental and somatic dysfunction of sacral region: Secondary | ICD-10-CM

## 2023-11-03 DIAGNOSIS — M9902 Segmental and somatic dysfunction of thoracic region: Secondary | ICD-10-CM | POA: Diagnosis not present

## 2023-11-03 DIAGNOSIS — M9903 Segmental and somatic dysfunction of lumbar region: Secondary | ICD-10-CM

## 2023-11-03 DIAGNOSIS — M25561 Pain in right knee: Secondary | ICD-10-CM | POA: Diagnosis not present

## 2023-11-03 DIAGNOSIS — M5416 Radiculopathy, lumbar region: Secondary | ICD-10-CM | POA: Diagnosis not present

## 2023-11-03 DIAGNOSIS — M9908 Segmental and somatic dysfunction of rib cage: Secondary | ICD-10-CM

## 2023-11-03 MED ORDER — METHYLPREDNISOLONE ACETATE 80 MG/ML IJ SUSP
80.0000 mg | Freq: Once | INTRAMUSCULAR | Status: AC
  Administered 2023-11-03: 80 mg via INTRAMUSCULAR

## 2023-11-03 MED ORDER — KETOROLAC TROMETHAMINE 30 MG/ML IJ SOLN
60.0000 mg | Freq: Once | INTRAMUSCULAR | Status: AC
  Administered 2023-11-03: 60 mg via INTRAMUSCULAR

## 2023-11-03 NOTE — Assessment & Plan Note (Addendum)
 Chronic, with exacerbation.  Seems to be at worsening symptoms.  Discussed icing regimen and home exercises, discussed which activities to do and which ones to avoid.  Increase activity slowly otherwise.  Still responding well to osteopathic manipulation.  Follow-up again with me in 6 to 8 weeks Toradol  and Depo-Medrol  injections given.

## 2023-11-03 NOTE — Patient Instructions (Signed)
 Good to see you! Cocktail injection today See you again in 7-8 weeks

## 2023-11-23 ENCOUNTER — Telehealth: Payer: Self-pay | Admitting: Family Medicine

## 2023-11-23 ENCOUNTER — Other Ambulatory Visit: Payer: Self-pay | Admitting: Family Medicine

## 2023-11-23 DIAGNOSIS — M5416 Radiculopathy, lumbar region: Secondary | ICD-10-CM

## 2023-11-23 NOTE — Telephone Encounter (Signed)
 Patient called and asked if Dr. Felipe Horton would consider an epidural for him. Please advise.

## 2023-11-23 NOTE — Telephone Encounter (Signed)
 Ordered epidural and notified patient via phone call.

## 2023-11-26 ENCOUNTER — Encounter: Payer: Self-pay | Admitting: Family Medicine

## 2023-11-26 NOTE — Discharge Instructions (Signed)

## 2023-11-27 ENCOUNTER — Other Ambulatory Visit

## 2023-11-30 ENCOUNTER — Ambulatory Visit
Admission: RE | Admit: 2023-11-30 | Discharge: 2023-11-30 | Disposition: A | Discharge status: Home / Self Care | Source: Ambulatory Visit | Attending: Family Medicine | Admitting: Family Medicine

## 2023-11-30 DIAGNOSIS — M5416 Radiculopathy, lumbar region: Secondary | ICD-10-CM

## 2023-11-30 MED ORDER — METHYLPREDNISOLONE ACETATE 40 MG/ML INJ SUSP (RADIOLOG
80.0000 mg | Freq: Once | INTRAMUSCULAR | Status: AC
Start: 2023-11-30 — End: 2023-11-30
  Administered 2023-11-30: 80 mg via EPIDURAL

## 2023-11-30 MED ORDER — IOPAMIDOL (ISOVUE-M 200) INJECTION 41%
1.0000 mL | Freq: Once | INTRAMUSCULAR | Status: AC
Start: 2023-11-30 — End: 2023-11-30
  Administered 2023-11-30: 1 mL via EPIDURAL

## 2023-12-07 NOTE — Progress Notes (Unsigned)
 [facility] [address] [place] [address] Phone: [phone] Subjective:   Joshua [name] am a scribe for Dr. Felipe Horton.  I'm seeing this patient by the request  of:  McClanahan, Kyra, NP  CC: Back and neck pain follow-up  BJY:[id number]  Joshua [name] is a 53 y.o. male coming in with complaint of back and neck pain. OMT on 11/03/2023. Patient states neck always hurts. Back is better as long as he states it easy.   Medications patient has been prescribed:   Taking:         Reviewed prior external information including notes and imaging from previsou exam, outside providers and external EMR if available.   As well as notes that were available from care everywhere and other healthcare systems.  Past medical history, social, surgical and family history all reviewed in electronic medical record.  No pertanent information unless stated regarding to the chief complaint.   Past Medical History:  Diagnosis Date   Alcoholism (HCC)    Anxiety and depression    BPH with obstruction/lower urinary tract symptoms    Chronic back pain    sports med   Chronic pain of right knee    Chronic pain of right knee    multiple surgeries.  Baker's cyst aspirated/injected by Dr. Felipe Horton 2022-->good results   History of kidney stones    Hypogonadism in male    OAB (overactive bladder)    urol   Sleep apnea    Stenosing tenosynovitis    R thumb and L middle finger-->good response to inj by ortho 07/2020    No Known Allergies   Review of Systems:  No headache, visual changes, nausea, vomiting, diarrhea, constipation, dizziness, abdominal pain, skin rash, fevers, chills, night sweats, weight loss, swollen lymph nodes, body aches, joint swelling, chest pain, shortness of breath, mood changes. POSITIVE muscle aches  Objective  Blood pressure 138/70, pulse 68, height 5' 10 (1.778 m), SpO2 98%.   General: No apparent distress alert and oriented x3 mood and  affect normal, dressed appropriately.  HEENT: Pupils equal, extraocular movements intact  Respiratory: Patient's speak in full sentences and does not appear short of breath  Cardiovascular: No lower extremity edema, non tender, no erythema  Gait MSK:  Back does have some loss lordosis noted.  Some tenderness to palpation in the paraspinal musculature.  Neck exam does have some limitation in sidebending.  Patient does have some crepitus noted as well.  Tightness noted in the neck with flexion extension.  No rigidity noted.  No radicular symptoms with Spurling's.  Osteopathic findings  C2 flexed rotated and side bent right C6 flexed rotated and side bent left T3 extended rotated and side bent right inhaled rib T9 extended rotated and side bent left L2 flexed rotated and side bent right L4 flexed rotated and side bent left Sacrum right on right    Assessment and Plan:  Degenerative disc disease, cervical Cervical arthritis, will continue to monitor.  No increase in tightness recently.  Toradol  and Depo-Medrol  injection given today for this as well as having some headache which patient never normally does not have.  Blood pressure unremarkable today.  Has had elevation in the blood pressure previously.  Patient has had too much testosterone as it seems to be high as well as a high hemoglobin.  We discussed the potential risk of stroke with this.  Patient states that he has had improvement but for with decreasing his dose and  he is going to do that at this time.  Patient will be traveling and understands that if worsening headaches to seek medical attention immediately.  Patient is in agreement with the plan.    Nonallopathic problems  Decision today to treat with OMT was based on Physical Exam  After verbal consent patient was treated with HVLA, ME, FPR techniques in cervical, rib, thoracic, lumbar, and sacral  areas  Patient tolerated the procedure well with improvement in  symptoms  Patient given exercises, stretches and lifestyle modifications  See medications in patient instructions if given  Patient will follow up in 4-8 weeks    The above documentation has been reviewed and is accurate and complete Joshua [name] M Joshua Govan, DO          Note: This dictation was prepared with Dragon dictation along with smaller phrase technology. Any transcriptional errors that result from this process are unintentional.

## 2023-12-08 ENCOUNTER — Encounter: Payer: Self-pay | Admitting: Family Medicine

## 2023-12-08 ENCOUNTER — Ambulatory Visit: Admitting: Family Medicine

## 2023-12-08 VITALS — BP 138/70 | HR 68 | Ht 70.0 in

## 2023-12-08 DIAGNOSIS — M9904 Segmental and somatic dysfunction of sacral region: Secondary | ICD-10-CM

## 2023-12-08 DIAGNOSIS — M5030 Other cervical disc degeneration, unspecified cervical region: Secondary | ICD-10-CM | POA: Diagnosis not present

## 2023-12-08 DIAGNOSIS — M9903 Segmental and somatic dysfunction of lumbar region: Secondary | ICD-10-CM

## 2023-12-08 DIAGNOSIS — M9902 Segmental and somatic dysfunction of thoracic region: Secondary | ICD-10-CM | POA: Diagnosis not present

## 2023-12-08 DIAGNOSIS — M9901 Segmental and somatic dysfunction of cervical region: Secondary | ICD-10-CM | POA: Diagnosis not present

## 2023-12-08 DIAGNOSIS — M9908 Segmental and somatic dysfunction of rib cage: Secondary | ICD-10-CM

## 2023-12-08 MED ORDER — KETOROLAC TROMETHAMINE 30 MG/ML IJ SOLN
30.0000 mg | Freq: Once | INTRAMUSCULAR | Status: AC
  Administered 2023-12-08: 30 mg via INTRAMUSCULAR

## 2023-12-08 MED ORDER — METHYLPREDNISOLONE ACETATE 40 MG/ML IJ SUSP
40.0000 mg | Freq: Once | INTRAMUSCULAR | Status: AC
  Administered 2023-12-08: 40 mg via INTRAMUSCULAR

## 2023-12-08 NOTE — Patient Instructions (Addendum)
 Good to see you! Cocktail injection today Headaches gets worse , get seen before plane See you again in 6 weeks

## 2023-12-08 NOTE — Assessment & Plan Note (Signed)
 Cervical arthritis, will continue to monitor.  No increase in tightness recently.  Toradol  and Depo-Medrol  injection given today for this as well as having some headache which patient never normally does not have.  Blood pressure unremarkable today.  Has had elevation in the blood pressure previously.  Patient has had too much testosterone as it seems to be high as well as a high hemoglobin.  We discussed the potential risk of stroke with this.  Patient states that he has had improvement but for with decreasing his dose and he is going to do that at this time.  Patient will be traveling and understands that if worsening headaches to seek medical attention immediately.  Patient is in agreement with the plan.

## 2024-01-19 NOTE — Progress Notes (Unsigned)
 [name] [address] [place] [address] Phone: [phone] Subjective:   Joshua [name], am serving as a scribe for Dr. Arthea Claudene.  I'm seeing this patient by the request  of:  McClanahan, Kyra, NP  CC: back and pain   YEP:Dlagzrupcz  Joshua [name] is a 53 y.o. male coming in with complaint of back and neck pain. OMT 12/08/2023. Patient states finger is stuck 3rd on the R hand. No other symptoms.  Medications patient has been prescribed: Celebrex   Taking:         Reviewed prior external information including notes and imaging from previsou exam, outside providers and external EMR if available.   As well as notes that were available from care everywhere and other healthcare systems.  Past medical history, social, surgical and family history all reviewed in electronic medical record.  No pertanent information unless stated regarding to the chief complaint.   Past Medical History:  Diagnosis Date   Alcoholism (HCC)    Anxiety and depression    BPH with obstruction/lower urinary tract symptoms    Chronic back pain    sports med   Chronic pain of right knee    Chronic pain of right knee    multiple surgeries.  Baker's cyst aspirated/injected by Dr. Claudene 2022-->good results   History of kidney stones    Hypogonadism in male    OAB (overactive bladder)    urol   Sleep apnea    Stenosing tenosynovitis    R thumb and L middle finger-->good response to inj by ortho 07/2020    No Known Allergies   Review of Systems:  No headache, visual changes, nausea, vomiting, diarrhea, constipation, dizziness, abdominal pain, skin rash, fevers, chills, night sweats, weight loss, swollen lymph nodes, body aches, joint swelling, chest pain, shortness of breath, mood changes. POSITIVE muscle aches  Objective  Blood pressure (!) 124/90, pulse 64, height 5' 10 (1.778 m), weight 215 lb (97.5 kg), SpO2 97%.   General: No apparent distress alert  and oriented x3 mood and affect normal, dressed appropriately.  HEENT: Pupils equal, extraocular movements intact  Respiratory: Patient's speak in full sentences and does not appear short of breath  Cardiovascular: No lower extremity edema, non tender, no erythema  Gait normal  MSK:  Back loss of lordosis tightness of the right scapular  Negative spurling   Osteopathic findings  C3 flexed rotated and side bent right C6 flexed rotated and side bent left T5 extended rotated and side bent right inhaled rib T11 extended rotated and side bent right  L2 flexed rotated and side bent right Sacrum right on right     Assessment and Plan:  SI (sacroiliac) joint dysfunction Arthritis, noted discussed HEP  Discussed HEP  Discussed increase activities to do and which ones to avoid.  RTC in 8-12 weeks     Nonallopathic problems  Decision today to treat with OMT was based on Physical Exam  After verbal consent patient was treated with HVLA, ME, FPR techniques in cervical, rib, thoracic, lumbar, and sacral  areas  Patient tolerated the procedure well with improvement in symptoms  Patient given exercises, stretches and lifestyle modifications  See medications in patient instructions if given  Patient will follow up in 4-8 weeks     The above documentation has been reviewed and is accurate and complete Lindia Garms M Carollyn Etcheverry, DO         Note: This dictation was prepared with Dragon dictation  along with smaller phrase technology. Any transcriptional errors that result from this process are unintentional.

## 2024-01-20 ENCOUNTER — Ambulatory Visit: Admitting: Family Medicine

## 2024-01-20 ENCOUNTER — Encounter: Payer: Self-pay | Admitting: Family Medicine

## 2024-01-20 VITALS — BP 124/90 | HR 64 | Ht 70.0 in | Wt 215.0 lb

## 2024-01-20 DIAGNOSIS — M5416 Radiculopathy, lumbar region: Secondary | ICD-10-CM

## 2024-01-20 DIAGNOSIS — M5030 Other cervical disc degeneration, unspecified cervical region: Secondary | ICD-10-CM | POA: Diagnosis not present

## 2024-01-20 DIAGNOSIS — M9903 Segmental and somatic dysfunction of lumbar region: Secondary | ICD-10-CM

## 2024-01-20 DIAGNOSIS — M9902 Segmental and somatic dysfunction of thoracic region: Secondary | ICD-10-CM | POA: Diagnosis not present

## 2024-01-20 DIAGNOSIS — M533 Sacrococcygeal disorders, not elsewhere classified: Secondary | ICD-10-CM | POA: Diagnosis not present

## 2024-01-20 DIAGNOSIS — M9901 Segmental and somatic dysfunction of cervical region: Secondary | ICD-10-CM

## 2024-01-20 MED ORDER — KETOROLAC TROMETHAMINE 30 MG/ML IJ SOLN
60.0000 mg | Freq: Once | INTRAMUSCULAR | Status: AC
  Administered 2024-01-20: 60 mg via INTRAMUSCULAR

## 2024-01-20 MED ORDER — METHYLPREDNISOLONE ACETATE 80 MG/ML IJ SUSP
80.0000 mg | Freq: Once | INTRAMUSCULAR | Status: AC
  Administered 2024-01-20: 80 mg via INTRAMUSCULAR

## 2024-01-20 NOTE — Patient Instructions (Signed)
 Good to see you! Cocktail today See you again in 2 months

## 2024-01-20 NOTE — Assessment & Plan Note (Signed)
 Due to some of the radicular symptoms still as well as the discomfort getting Toradol  and Depo-Medrol  today.

## 2024-01-20 NOTE — Assessment & Plan Note (Signed)
 Arthritis, noted discussed HEP  Discussed HEP  Discussed increase activities to do and which ones to avoid.  RTC in 8-12 weeks

## 2024-02-23 ENCOUNTER — Telehealth: Payer: Self-pay | Admitting: Family Medicine

## 2024-02-23 ENCOUNTER — Other Ambulatory Visit: Payer: Self-pay

## 2024-02-23 DIAGNOSIS — M5416 Radiculopathy, lumbar region: Secondary | ICD-10-CM

## 2024-02-23 NOTE — Telephone Encounter (Signed)
Patient called asking if another epidural could be ordered for him?  Please advise.

## 2024-03-07 NOTE — Telephone Encounter (Signed)
 Patient called and stated he had not heard from anyone to schedule for the epidural. Provided patient with the phone number to call and schedule.

## 2024-03-15 NOTE — Discharge Instructions (Addendum)
    Post Procedure Spinal Discharge Instruction Sheet  You may resume a regular diet and any medications that you routinely take (including pain medications) unless otherwise noted by MD.    No driving day of procedure.  Light activity throughout the rest of the day.  Do not do any strenuous work, exercise, bending or lifting.  The day following the procedure, you can resume normal physical activity but you should refrain from exercising or physical therapy for at least three days thereafter.  You may apply ice to the injection site, 20 minutes on, 20 minutes off, as needed. Do not apply ice directly to skin.    Common Side Effects:  Headaches- take your usual medications as directed by your physician.  Increase your fluid intake.  Caffeinated beverages may be helpful.  Lie flat in bed until your headache resolves.  Restlessness or inability to sleep- you may have trouble sleeping for the next few days.  Ask your referring physician if you need any medication for sleep.  Facial flushing or redness- should subside within a few days.  Increased pain- a temporary increase in pain a day or two following your procedure is not unusual.  Take your pain medication as prescribed by your referring physician.  Leg cramps  Please contact our office at [phone] for the following symptoms: Fever greater than 100 degrees. Headaches unresolved with medication after 2-3 days. Increased swelling, pain, or redness at injection site.   Thank you for visiting [facility] today!   YOU MAY RESUME YOUR ASPIRIN  TODAY

## 2024-03-16 ENCOUNTER — Ambulatory Visit
Admission: RE | Admit: 2024-03-16 | Discharge: 2024-03-16 | Disposition: A | Discharge status: Home / Self Care | Source: Ambulatory Visit | Attending: Family Medicine | Admitting: Family Medicine

## 2024-03-16 DIAGNOSIS — M5416 Radiculopathy, lumbar region: Secondary | ICD-10-CM

## 2024-03-16 MED ORDER — IOPAMIDOL (ISOVUE-M 200) INJECTION 41%
1.0000 mL | Freq: Once | INTRAMUSCULAR | Status: AC
  Administered 2024-03-16: 1 mL via EPIDURAL

## 2024-03-16 MED ORDER — METHYLPREDNISOLONE ACETATE 40 MG/ML INJ SUSP (RADIOLOG
80.0000 mg | Freq: Once | INTRAMUSCULAR | Status: AC
  Administered 2024-03-16: 120 mg via EPIDURAL

## 2024-03-17 NOTE — Progress Notes (Signed)
 [name] [address] [place] [address] Phone: [phone] Subjective:   Joshua [name], am serving as a scribe for Dr. Arthea [name].  I'm seeing this patient by the request  of:  McClanahan, Kyra, NP  CC: Finger pain follow-up  Joshua  [place] K [name] is a 53 y.o. male coming in with complaint of back and neck pain. OMT 01/20/2024.  Has also a trigger finger.  Index finger on the right hand.  Given injection in December of last year.  Patient states had epidural last week. Doing better. R hand 2nd and 3rd triggering.  Medications patient has been prescribed: None  Taking:         Reviewed prior external information including notes and imaging from previsou exam, outside providers and external EMR if available.   As well as notes that were available from care everywhere and other healthcare systems.  Past medical history, social, surgical and family history all reviewed in electronic medical record.  No pertanent information unless stated regarding to the chief complaint.   Past Medical History:  Diagnosis Date   Alcoholism (HCC)    Anxiety and depression    BPH with obstruction/lower urinary tract symptoms    Chronic back pain    sports med   Chronic pain of right knee    Chronic pain of right knee    multiple surgeries.  Baker's cyst aspirated/injected by Dr. Claudene 2022-->good results   History of kidney stones    Hypogonadism in male    OAB (overactive bladder)    urol   Sleep apnea    Stenosing tenosynovitis    R thumb and L middle finger-->good response to inj by ortho 07/2020    No Known Allergies   Review of Systems:  No headache, visual changes, nausea, vomiting, diarrhea, constipation, dizziness, abdominal pain, skin rash, fevers, chills, night sweats, weight loss, swollen lymph nodes, body aches, joint swelling, chest pain, shortness of breath, mood changes. POSITIVE muscle aches  Objective  Blood pressure  132/80, pulse 86, height 5' 10 (1.778 m), weight 218 lb (98.9 kg), SpO2 97%.   General: No apparent distress alert and oriented x3 mood and affect normal, dressed appropriately.  HEENT: Pupils equal, extraocular movements intact  Respiratory: Patient's speak in full sentences and does not appear short of breath  Cardiovascular: No lower extremity edema, non tender, no erythema  Finger exam show s tender to palpation over the A2 pulley of the index and middle finger.  Procedure: Real-time Ultrasound Guided Injection of flexor tendon sheath of the index finger and middle finger Device: GE Logiq Q7 Ultrasound guided injection is preferred based studies that show increased duration, increased effect, greater accuracy, decreased procedural pain, increased response rate, and decreased cost with ultrasound guided versus blind injection.  Verbal informed consent obtained.  Time-out conducted.  Noted no overlying erythema, induration, or other signs of local infection.  Skin prepped in a sterile fashion.  Local anesthesia: Topical Ethyl chloride.  With sterile technique and under real time ultrasound guidance: With a 25-gauge half inch needle injected with 0.5 cc of 0.5% Marcaine  and 0.5 cc of Kenalog  40 mg/mL.  Did again with the middle finger in the same flexor tendon sheath distribution. Completed without difficulty  Pain immediately resolved suggesting accurate placement of the medication.  Advised to call if fevers/chills, erythema, induration, drainage, or persistent bleeding.  Impression: Technically successful ultrasound guided injection.         Assessment  and Plan:  Trigger finger, right Patient given injection and tolerated the procedures well today.  Discussed icing regimen and home exercises, discussed which activities to do and which ones to avoid.  Increase activity slowly.  Discussed icing regimen.  Follow-up again in 6 to 8 weeks.     [name] The above documentation has  been reviewed and is accurate and complete Joshua CHRISTELLA Sharps, DO          Note: This dictation was prepared with Dragon dictation along with smaller phrase technology. Any transcriptional errors that result from this process are unintentional.

## 2024-03-22 ENCOUNTER — Other Ambulatory Visit: Payer: Self-pay

## 2024-03-22 ENCOUNTER — Ambulatory Visit: Admitting: Family Medicine

## 2024-03-22 ENCOUNTER — Encounter: Payer: Self-pay | Admitting: Family Medicine

## 2024-03-22 VITALS — BP 132/80 | HR 86 | Ht 70.0 in | Wt 218.0 lb

## 2024-03-22 DIAGNOSIS — M6530 Trigger finger, unspecified finger: Secondary | ICD-10-CM

## 2024-03-22 DIAGNOSIS — M65321 Trigger finger, right index finger: Secondary | ICD-10-CM | POA: Diagnosis not present

## 2024-03-22 DIAGNOSIS — M65331 Trigger finger, right middle finger: Secondary | ICD-10-CM | POA: Diagnosis not present

## 2024-03-22 NOTE — Patient Instructions (Signed)
 Injection in fingers today Good to see you! See you again in

## 2024-03-22 NOTE — Assessment & Plan Note (Signed)
 Patient given injection and tolerated the procedures well today.  Discussed icing regimen and home exercises, discussed which activities to do and which ones to avoid.  Increase activity slowly.  Discussed icing regimen.  Follow-up again in 6 to 8 weeks.

## 2024-03-23 ENCOUNTER — Ambulatory Visit (INDEPENDENT_AMBULATORY_CARE_PROVIDER_SITE_OTHER)

## 2024-03-23 ENCOUNTER — Telehealth: Payer: Self-pay | Admitting: Family Medicine

## 2024-03-23 ENCOUNTER — Other Ambulatory Visit: Payer: Self-pay

## 2024-03-23 DIAGNOSIS — M545 Low back pain, unspecified: Secondary | ICD-10-CM

## 2024-03-23 DIAGNOSIS — M5416 Radiculopathy, lumbar region: Secondary | ICD-10-CM

## 2024-03-23 MED ORDER — TIZANIDINE HCL 2 MG PO TABS: 2.0000 mg | ORAL_TABLET | Freq: Every day | ORAL | 0 refills | Status: AC

## 2024-03-23 MED ORDER — KETOROLAC TROMETHAMINE 60 MG/2ML IM SOLN
60.0000 mg | Freq: Once | INTRAMUSCULAR | Status: AC
  Administered 2024-03-23: 60 mg via INTRAMUSCULAR

## 2024-03-23 MED ORDER — METHYLPREDNISOLONE ACETATE 80 MG/ML IJ SUSP
80.0000 mg | Freq: Once | INTRAMUSCULAR | Status: AC
  Administered 2024-03-23: 80 mg via INTRAMUSCULAR

## 2024-03-23 NOTE — Telephone Encounter (Signed)
 Pt called, hurt his back yesterday and states it is 10x his normal level of low back pain. Not sure if he would be able to come in. Would like something called in to help with the tightness,  [facility]

## 2024-03-23 NOTE — Progress Notes (Signed)
 Patient received Toradol  60 mg in left buttocks and Prednisone  80 mg in right buttocks. Patient tolerated well.

## 2024-03-25 ENCOUNTER — Ambulatory Visit: Payer: Self-pay | Admitting: Family Medicine

## 2024-03-25 NOTE — Telephone Encounter (Signed)
 Pt called, does not feel any better after injections and taking muscle relaxers and advils.  We are calling radiology to try to expedite his reading.  Unsure what else to offer him at this time, other meds or injection?

## 2024-04-25 ENCOUNTER — Encounter: Payer: Self-pay | Admitting: Radiology

## 2024-05-02 NOTE — Progress Notes (Unsigned)
 [name] [address] [place] [address] Phone: [phone] Subjective:   Joshua [name], am serving as a scribe for Dr. Arthea [name].  I'[name] seeing this patient by the request  of:  [name], Kyra, NP  CC: New low back pain, hand pain  YEP:Dlagzrupcz  03/22/2024 Patient given injection and tolerated the procedures well today.  Discussed icing regimen and home exercises, discussed which activities to do and which ones to avoid.  Increase activity slowly.  Discussed icing regimen.  Follow-up again in 6 to 8 weeks.      Update 05/04/2024 Joshua [name] is a 52 y.o. male coming in with complaint of R hand trigger finger. Patient states here for MSK.    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  Last epidural was in September of this year.  Past Medical History:  Diagnosis Date   Alcoholism (HCC)    Anxiety and depression    BPH with obstruction/lower urinary tract symptoms    Chronic back pain    sports med   Chronic pain of right knee    Chronic pain of right knee    multiple surgeries.  Baker's cyst aspirated/injected by Dr. Claudene 2022-->good results   History of kidney stones    Hypogonadism in male    OAB (overactive bladder)    urol   Sleep apnea    Stenosing tenosynovitis    R thumb and L middle finger-->good response to inj by ortho 07/2020   Past Surgical History:  Procedure Laterality Date   PARTIAL KNEE ARTHROPLASTY Right 05/12/2022   Procedure: RIGHT UNICOMPARTMENTAL KNEE;  Surgeon: Jerri Kay HERO, MD;  Location: [facility] OR;  Service: Orthopedics;  Laterality: Right;   righ knee surgery     5+.  Meniscectomy among others   Social History   Socioeconomic History   Marital status: Married    Spouse name: Not on file   Number of children: Not on file   Years of education: Not on file   Highest education level: Not on file  Occupational History   Not on file  Tobacco Use   Smoking  status: Never   Smokeless tobacco: Former  [employer]   Vaping status: Never Used  Substance and Sexual Activity   Alcohol use: Yes    Alcohol/week: 3.0 standard drinks of alcohol    Types: 3 Cans of beer per week   Drug use: Not on file   Sexual activity: Not on file  Other Topics Concern   Not on file  Social History Narrative   Not on file   Social Drivers of Health   Financial Resource Strain: Low Risk  (10/08/2023)   Received from [facility]   Overall Financial Resource Strain (CARDIA)    Difficulty of Paying Living Expenses: Not hard at all  Food Insecurity: No Food Insecurity (10/08/2023)   Received from [facility]   Hunger Vital Sign    Within the past 12 months, you worried that your food would run out before you got the money to buy more.: Never true    Within the past 12 months, the food you bought just didn't last and you didn't have money to get more.: Never true  Transportation Needs: No Transportation Needs (10/08/2023)   Received from [facility] - Transportation    Lack of Transportation (Medical): No    Lack of Transportation (Non-Medical): No  Physical Activity: Not  on file  Stress: Not on file  Social Connections: Unknown (11/05/2021)   Received from [facility]   Social Network    Social Network: Not on file   No Known Allergies No family history on file.  Current Outpatient Medications (Endocrine & Metabolic):    predniSONE  (DELTASONE ) 50 MG tablet, Take one tablet daily for the next 5 days.   testosterone cypionate (DEPOTESTOSTERONE CYPIONATE) 200 MG/ML injection, Inject 100 mg into the muscle every 7 (seven) days.    Current Outpatient Medications (Analgesics):    celecoxib  (CELEBREX ) 200 MG capsule, TAKE 1 CAPSULE BY MOUTH 2 TIMES DAILY   meloxicam  (MOBIC ) 15 MG tablet, Take 1 tablet (15 mg total) by mouth daily.   Current Outpatient Medications (Other):    gabapentin  (NEURONTIN ) 100 MG capsule, Take 2 capsules (200 mg  total) by mouth at bedtime.   gabapentin  (NEURONTIN ) 100 MG capsule, Take 2 capsules (200 mg total) by mouth at bedtime.   gabapentin  (NEURONTIN ) 300 MG capsule, Take 300 mg by mouth daily as needed (pain).   silodosin (RAPAFLO) 8 MG CAPS capsule, Take 8 mg by mouth daily.   Vitamin D , Ergocalciferol , (DRISDOL ) 1.25 MG (50000 UNIT) CAPS capsule, TAKE 1 CAPSULE (50,000 UNITS TOTAL) BY MOUTH EVERY 7 (SEVEN) DAYS   Reviewed prior external information including notes and imaging from  primary care provider As well as notes that were available from care everywhere and other healthcare systems.  Past medical history, social, surgical and family history all reviewed in electronic medical record.  No pertanent information unless stated regarding to the chief complaint.   Review of Systems:  No headache, visual changes, nausea, vomiting, diarrhea, constipation, dizziness, abdominal pain, skin rash, fevers, chills, night sweats, weight loss, swollen lymph nodes, body aches, joint swelling, chest pain, shortness of breath, mood changes. POSITIVE muscle aches  Objective  There were no vitals taken for this visit.   General: No apparent distress alert and oriented x3 mood and affect normal, dressed appropriately.  HEENT: Pupils equal, extraocular movements intact  Respiratory: Patient's speak in full sentences and does not appear short of breath  Cardiovascular: No lower extremity edema, non tender, no erythema      Impression and Recommendations:  No problem-specific Assessment & Plan notes found for this encounter.     Decision today to treat with OMT was based on Physical Exam  After verbal consent patient was treated with HVLA, ME, FPR techniques in cervical, thoracic, rib, lumbar and sacral areas, all areas are chronic   Patient tolerated the procedure well with improvement in symptoms  Patient given exercises, stretches and lifestyle modifications  See medications in patient  instructions if given  Patient will follow up in 4-8 weeks  The above documentation has been reviewed and is accurate and complete [name] Joshua Yamamoto, DO

## 2024-05-04 ENCOUNTER — Ambulatory Visit (INDEPENDENT_AMBULATORY_CARE_PROVIDER_SITE_OTHER): Admitting: Family Medicine

## 2024-05-04 VITALS — BP 130/86 | HR 81 | Ht 70.0 in | Wt 218.0 lb

## 2024-05-04 DIAGNOSIS — M9903 Segmental and somatic dysfunction of lumbar region: Secondary | ICD-10-CM | POA: Diagnosis not present

## 2024-05-04 DIAGNOSIS — M9901 Segmental and somatic dysfunction of cervical region: Secondary | ICD-10-CM | POA: Diagnosis not present

## 2024-05-04 DIAGNOSIS — M5030 Other cervical disc degeneration, unspecified cervical region: Secondary | ICD-10-CM

## 2024-05-04 DIAGNOSIS — M9902 Segmental and somatic dysfunction of thoracic region: Secondary | ICD-10-CM

## 2024-05-04 DIAGNOSIS — M9904 Segmental and somatic dysfunction of sacral region: Secondary | ICD-10-CM

## 2024-05-04 DIAGNOSIS — M533 Sacrococcygeal disorders, not elsewhere classified: Secondary | ICD-10-CM | POA: Diagnosis not present

## 2024-05-04 DIAGNOSIS — M9908 Segmental and somatic dysfunction of rib cage: Secondary | ICD-10-CM

## 2024-05-04 NOTE — Assessment & Plan Note (Signed)
 DDD  Discussed HEP  Discussed which activities to do and which ones to avoid.  RTC in 6-8 weeks

## 2024-05-04 NOTE — Assessment & Plan Note (Signed)
 Discussed which activities have been discussed which activities to determine which ones to avoid.  Continuing to be active.  Will continue to monitor the trigger finger.  Follow-up again in 6 to 8 weeks

## 2024-06-03 ENCOUNTER — Telehealth: Payer: Self-pay | Admitting: Family Medicine

## 2024-06-03 ENCOUNTER — Other Ambulatory Visit: Payer: Self-pay

## 2024-06-03 DIAGNOSIS — M5416 Radiculopathy, lumbar region: Secondary | ICD-10-CM

## 2024-06-03 NOTE — Telephone Encounter (Signed)
 Pt requesting repeat epidural. Wants the one that goes to the right side, not directly into the spine.  Last OV 05/04/2024 Last epidural 03/16/2024

## 2024-06-07 NOTE — Telephone Encounter (Signed)
 Called pt, given [facility] phone # to call for epidural scheduling.

## 2024-06-13 NOTE — Progress Notes (Signed)
 " [name] [address] [place] [address] Phone: [phone] Subjective:   Joshua [name], am serving as a scribe for Dr. Arthea [name].  I'm seeing this patient by the request  of:  McClanahan, Kyra, NP  CC: back and neck pain follow up   YEP:Dlagzrupcz  Joshua [name] is a 53 y.o. male coming in with complaint of back and neck pain. OMT 05/04/2024. Patient states doing the same. Back and neck in discomfort. No new symptoms.  Medications patient has been prescribed: None  Taking:         Reviewed prior external information including notes and imaging from previsou exam, outside providers and external EMR if available.   As well as notes that were available from care everywhere and other healthcare systems.  Past medical history, social, surgical and family history all reviewed in electronic medical record.  No pertanent information unless stated regarding to the chief complaint.   Past Medical History:  Diagnosis Date   Alcoholism (HCC)    Anxiety and depression    BPH with obstruction/lower urinary tract symptoms    Chronic back pain    sports med   Chronic pain of right knee    Chronic pain of right knee    multiple surgeries.  Baker's cyst aspirated/injected by Dr. Claudene 2022-->good results   History of kidney stones    Hypogonadism in male    OAB (overactive bladder)    urol   Sleep apnea    Stenosing tenosynovitis    R thumb and L middle finger-->good response to inj by ortho 07/2020    Allergies[1]   Review of Systems:  No headache, visual changes, nausea, vomiting, diarrhea, constipation, dizziness, abdominal pain, skin rash, fevers, chills, night sweats, weight loss, swollen lymph nodes, body aches, joint swelling, chest pain, shortness of breath, mood changes. POSITIVE muscle aches  Objective  Blood pressure 122/76, pulse 77, height 5' 10 (1.778 m), weight 217 lb (98.4 kg), SpO2 97%.   General: No apparent  distress alert and oriented x3 mood and affect normal, dressed appropriately.  HEENT: Pupils equal, extraocular movements intact  Respiratory: Patient's speak in full sentences and does not appear short of breath  Cardiovascular: No lower extremity edema, non tender, no erythema  Gait normal  MSK:  Back does have loss of lordosis tightness noted in the lumbar spine as well as in the neck itself.  More tight everywhere else.  Osteopathic findings  C2 flexed rotated and side bent right C6 flexed rotated and side bent left T3 extended rotated and side bent right inhaled rib T9 extended rotated and side bent left L2 flexed rotated and side bent right L4 flexed rotated and side bent right Sacrum right on right     Assessment and Plan:  Lumbar radiculopathy Known radicular symptoms.  Discussed icing regimen and home exercises.  Discussed core strengthening exercises still.  Is going to start training to do patient's jujitsu again.  Increase activity slowly.  Discussed icing regimen.  Follow-up again in 6 to 12 weeks we will begin an epidural here in the near future.  Follow-up with me again as stated in 6 to 12 weeks   Nonallopathic problems  Decision today to treat with OMT was based on Physical Exam  After verbal consent patient was treated with HVLA, ME, FPR techniques in cervical, rib, thoracic, lumbar, and sacral  areas  Patient tolerated the procedure well with improvement in symptoms  Patient given  exercises, stretches and lifestyle modifications  See medications in patient instructions if given  Patient will follow up in 6-8 weeks    The above documentation has been reviewed and is accurate and complete Joshua Blank M Raeqwon Lux, DO          Note: This dictation was prepared with Dragon dictation along with smaller phrase technology. Any transcriptional errors that result from this process are unintentional.            [1] No Known Allergies  "

## 2024-06-15 ENCOUNTER — Ambulatory Visit: Admitting: Family Medicine

## 2024-06-15 ENCOUNTER — Encounter: Payer: Self-pay | Admitting: Family Medicine

## 2024-06-15 VITALS — BP 122/76 | HR 77 | Ht 70.0 in | Wt 217.0 lb

## 2024-06-15 DIAGNOSIS — M5416 Radiculopathy, lumbar region: Secondary | ICD-10-CM

## 2024-06-15 DIAGNOSIS — M9904 Segmental and somatic dysfunction of sacral region: Secondary | ICD-10-CM | POA: Diagnosis not present

## 2024-06-15 DIAGNOSIS — M9902 Segmental and somatic dysfunction of thoracic region: Secondary | ICD-10-CM | POA: Diagnosis not present

## 2024-06-15 DIAGNOSIS — M9903 Segmental and somatic dysfunction of lumbar region: Secondary | ICD-10-CM

## 2024-06-15 DIAGNOSIS — M9901 Segmental and somatic dysfunction of cervical region: Secondary | ICD-10-CM | POA: Diagnosis not present

## 2024-06-15 DIAGNOSIS — M9908 Segmental and somatic dysfunction of rib cage: Secondary | ICD-10-CM | POA: Diagnosis not present

## 2024-06-15 NOTE — Assessment & Plan Note (Addendum)
 Known radicular symptoms.  Discussed icing regimen and home exercises.  Discussed core strengthening exercises still.  Is going to start training to do patient's jujitsu again.  Increase activity slowly.  Discussed icing regimen.  Follow-up again in 6 to 12 weeks we will begin an epidural here in the near future.  Follow-up with me again as stated in 6 to 12 weeks

## 2024-06-15 NOTE — Patient Instructions (Signed)
 Happy Holidays See you again in 7-8 weeks

## 2024-06-22 NOTE — Discharge Instructions (Signed)

## 2024-06-24 ENCOUNTER — Other Ambulatory Visit: Payer: Self-pay | Admitting: Family Medicine

## 2024-06-24 ENCOUNTER — Ambulatory Visit
Admission: RE | Admit: 2024-06-24 | Discharge: 2024-06-24 | Disposition: A | Discharge status: Home / Self Care | Source: Ambulatory Visit | Attending: Family Medicine | Admitting: Family Medicine

## 2024-06-24 DIAGNOSIS — M5416 Radiculopathy, lumbar region: Secondary | ICD-10-CM

## 2024-06-24 MED ORDER — METHYLPREDNISOLONE ACETATE 40 MG/ML INJ SUSP (RADIOLOG: 80.0000 mg | Freq: Once | INTRAMUSCULAR | Status: DC

## 2024-06-24 MED ORDER — IOPAMIDOL (ISOVUE-M 200) INJECTION 41%
1.0000 mL | Freq: Once | INTRAMUSCULAR | Status: AC
  Administered 2024-06-24: 1 mL via INTRA_ARTICULAR

## 2024-06-24 MED ORDER — IOPAMIDOL (ISOVUE-M 200) INJECTION 41%: 1.0000 mL | Freq: Once | INTRAMUSCULAR | Status: DC

## 2024-06-24 MED ORDER — METHYLPREDNISOLONE ACETATE 40 MG/ML INJ SUSP (RADIOLOG
80.0000 mg | Freq: Once | INTRAMUSCULAR | Status: AC
  Administered 2024-06-24: 80 mg via INTRA_ARTICULAR

## 2024-07-22 ENCOUNTER — Telehealth: Payer: Self-pay | Admitting: Family Medicine

## 2024-07-22 NOTE — Telephone Encounter (Signed)
 Last injection done 06/2024 was a little different from his previous epidurals. Pt was very happy with it and felt great but it is starting to wear off.  How often can he have these types of injections?  Can he do trigger point/steroid injections in between?

## 2024-07-23 ENCOUNTER — Encounter: Payer: Self-pay | Admitting: Family Medicine

## 2024-07-27 NOTE — Progress Notes (Unsigned)
 " [name] [address] [place] [address] Phone: [phone] Subjective:   Joshua [name], am serving as a scribe for Dr. Arthea Claudene.  I'm seeing this patient by the request  of:  McClanahan, Kyra, NP  CC: Back and neck pain follow-up  YEP:Dlagzrupcz  Joshua [name] is a 54 y.o. male coming in with complaint of back and neck pain. OMT on 06/15/2024.  Patient had undergone also an epidural on the back.  States that it relieved most of his pain but unfortunately wore off quickly.  This was a right sided L5-S1 facet injection.  This was done on January 2.  Patient states that his pain drastically decreased with the last facet injection.   Medications patient has been prescribed:   Taking:         Reviewed prior external information including notes and imaging from previsou exam, outside providers and external EMR if available.   As well as notes that were available from care everywhere and other healthcare systems.  Past medical history, social, surgical and family history all reviewed in electronic medical record.  No pertanent information unless stated regarding to the chief complaint.   Past Medical History:  Diagnosis Date   Alcoholism (HCC)    Anxiety and depression    BPH with obstruction/lower urinary tract symptoms    Chronic back pain    sports med   Chronic pain of right knee    Chronic pain of right knee    multiple surgeries.  Baker's cyst aspirated/injected by Dr. Claudene 2022-->good results   History of kidney stones    Hypogonadism in male    OAB (overactive bladder)    urol   Sleep apnea    Stenosing tenosynovitis    R thumb and L middle finger-->good response to inj by ortho 07/2020    Allergies[1]   Review of Systems:  No headache, visual changes, nausea, vomiting, diarrhea, constipation, dizziness, abdominal pain, skin rash, fevers, chills, night sweats, weight loss, swollen lymph nodes, body aches, joint  swelling, chest pain, shortness of breath, mood changes. POSITIVE muscle aches  Objective  There were no vitals taken for this visit.   General: No apparent distress alert and oriented x3 mood and affect normal, dressed appropriately.  HEENT: Pupils equal, extraocular movements intact  Respiratory: Patient's speak in full sentences and does not appear short of breath  Cardiovascular: No lower extremity edema, non tender, no erythema  Gait MSK:  Back   Osteopathic findings  C2 flexed rotated and side bent right C6 flexed rotated and side bent left T3 extended rotated and side bent right inhaled rib T9 extended rotated and side bent left L2 flexed rotated and side bent right Sacrum right on right     Assessment and Plan:  No problem-specific Assessment & Plan notes found for this encounter.    Nonallopathic problems  Decision today to treat with OMT was based on Physical Exam  After verbal consent patient was treated with HVLA, ME, FPR techniques in cervical, rib, thoracic, lumbar, and sacral  areas  Patient tolerated the procedure well with improvement in symptoms  Patient given exercises, stretches and lifestyle modifications  See medications in patient instructions if given  Patient will follow up in 4-8 weeks    The above documentation has been reviewed and is accurate and complete Joshua [name] M Joshua Weyand, DO          Note: This dictation was prepared with Dragon dictation  along with smaller phrase technology. Any transcriptional errors that result from this process are unintentional.            [1] No Known Allergies  "

## 2024-07-28 ENCOUNTER — Ambulatory Visit: Admitting: Family Medicine

## 2024-07-28 VITALS — BP 124/84 | HR 95 | Ht 70.0 in | Wt 222.0 lb

## 2024-07-28 DIAGNOSIS — M9904 Segmental and somatic dysfunction of sacral region: Secondary | ICD-10-CM

## 2024-07-28 DIAGNOSIS — M9901 Segmental and somatic dysfunction of cervical region: Secondary | ICD-10-CM

## 2024-07-28 DIAGNOSIS — M9903 Segmental and somatic dysfunction of lumbar region: Secondary | ICD-10-CM

## 2024-07-28 DIAGNOSIS — M9908 Segmental and somatic dysfunction of rib cage: Secondary | ICD-10-CM

## 2024-07-28 DIAGNOSIS — M47816 Spondylosis without myelopathy or radiculopathy, lumbar region: Secondary | ICD-10-CM

## 2024-07-28 DIAGNOSIS — M9902 Segmental and somatic dysfunction of thoracic region: Secondary | ICD-10-CM

## 2024-07-28 DIAGNOSIS — M5416 Radiculopathy, lumbar region: Secondary | ICD-10-CM

## 2024-07-28 NOTE — Patient Instructions (Signed)
 L5/S1 facet R side See me again in 6-8 weeks

## 2024-07-29 ENCOUNTER — Encounter: Payer: Self-pay | Admitting: Family Medicine

## 2024-07-29 NOTE — Discharge Instructions (Signed)

## 2024-07-29 NOTE — Assessment & Plan Note (Signed)
 Known arthritic changes that responded extremely well to a facet injection.  Will try another facet injection and see if patient is a candidate for a radiofrequency ablation at the L5-S1 area.  Patient will follow-up 6 weeks after injection and see how patient is responding.  More of the facet pain than true radicular symptoms at this time

## 2024-08-01 ENCOUNTER — Inpatient Hospital Stay: Admission: RE | Admit: 2024-08-01

## 2024-09-13 ENCOUNTER — Ambulatory Visit: Admitting: Family Medicine
